# Patient Record
Sex: Female | Born: 1955 | Race: White | Hispanic: No | Marital: Single | Smoking: Former smoker
Health system: Southern US, Community
[De-identification: ages and names within clinical notes are randomized; demographics above are authoritative.]

## PROBLEM LIST (undated history)

## (undated) DIAGNOSIS — M1990 Unspecified osteoarthritis, unspecified site: Secondary | ICD-10-CM

## (undated) DIAGNOSIS — Z8679 Personal history of other diseases of the circulatory system: Secondary | ICD-10-CM

## (undated) DIAGNOSIS — J189 Pneumonia, unspecified organism: Secondary | ICD-10-CM

## (undated) DIAGNOSIS — Z8489 Family history of other specified conditions: Secondary | ICD-10-CM

## (undated) DIAGNOSIS — Z8719 Personal history of other diseases of the digestive system: Secondary | ICD-10-CM

## (undated) DIAGNOSIS — I10 Essential (primary) hypertension: Secondary | ICD-10-CM

## (undated) DIAGNOSIS — E0500 Thyrotoxicosis with diffuse goiter without thyrotoxic crisis or storm: Secondary | ICD-10-CM

## (undated) DIAGNOSIS — E039 Hypothyroidism, unspecified: Secondary | ICD-10-CM

## (undated) DIAGNOSIS — K5792 Diverticulitis of intestine, part unspecified, without perforation or abscess without bleeding: Secondary | ICD-10-CM

## (undated) DIAGNOSIS — R87619 Unspecified abnormal cytological findings in specimens from cervix uteri: Secondary | ICD-10-CM

## (undated) DIAGNOSIS — E785 Hyperlipidemia, unspecified: Secondary | ICD-10-CM

## (undated) HISTORY — DX: Unspecified abnormal cytological findings in specimens from cervix uteri: R87.619

## (undated) HISTORY — DX: Personal history of other diseases of the circulatory system: Z86.79

## (undated) HISTORY — DX: Thyrotoxicosis with diffuse goiter without thyrotoxic crisis or storm: E05.00

## (undated) HISTORY — PX: COCCYX REMOVAL: SHX600

## (undated) HISTORY — PX: NASAL SEPTUM SURGERY: SHX37

## (undated) HISTORY — PX: BICEPS TENDON REPAIR: SHX566

## (undated) HISTORY — DX: Hyperlipidemia, unspecified: E78.5

## (undated) HISTORY — DX: Diverticulitis of intestine, part unspecified, without perforation or abscess without bleeding: K57.92

## (undated) HISTORY — PX: KNEE RECONSTRUCTION: SHX5883

## (undated) HISTORY — DX: Personal history of other diseases of the digestive system: Z87.19

## (undated) HISTORY — DX: Essential (primary) hypertension: I10

## (undated) HISTORY — PX: CERVICAL CONE BIOPSY: SUR198

---

## 1995-04-12 DIAGNOSIS — E0500 Thyrotoxicosis with diffuse goiter without thyrotoxic crisis or storm: Secondary | ICD-10-CM

## 1995-04-12 HISTORY — DX: Thyrotoxicosis with diffuse goiter without thyrotoxic crisis or storm: E05.00

## 2000-07-31 ENCOUNTER — Other Ambulatory Visit: Admission: RE | Admit: 2000-07-31 | Discharge: 2000-07-31 | Payer: Self-pay | Admitting: Internal Medicine

## 2001-03-26 ENCOUNTER — Ambulatory Visit (HOSPITAL_COMMUNITY): Admission: RE | Admit: 2001-03-26 | Discharge: 2001-03-26 | Payer: Self-pay | Admitting: Internal Medicine

## 2001-03-26 ENCOUNTER — Encounter: Payer: Self-pay | Admitting: Internal Medicine

## 2001-07-20 ENCOUNTER — Other Ambulatory Visit: Admission: RE | Admit: 2001-07-20 | Discharge: 2001-07-20 | Payer: Self-pay | Admitting: Internal Medicine

## 2001-08-28 ENCOUNTER — Ambulatory Visit (HOSPITAL_BASED_OUTPATIENT_CLINIC_OR_DEPARTMENT_OTHER): Admission: RE | Admit: 2001-08-28 | Discharge: 2001-08-28 | Payer: Self-pay | Admitting: Orthopaedic Surgery

## 2002-10-23 ENCOUNTER — Other Ambulatory Visit: Admission: RE | Admit: 2002-10-23 | Discharge: 2002-10-23 | Payer: Self-pay | Admitting: Family Medicine

## 2003-02-25 ENCOUNTER — Ambulatory Visit (HOSPITAL_COMMUNITY): Admission: RE | Admit: 2003-02-25 | Discharge: 2003-02-25 | Payer: Self-pay | Admitting: Family Medicine

## 2003-07-18 ENCOUNTER — Ambulatory Visit (HOSPITAL_COMMUNITY): Admission: RE | Admit: 2003-07-18 | Discharge: 2003-07-18 | Payer: Self-pay | Admitting: Family Medicine

## 2004-03-19 ENCOUNTER — Ambulatory Visit (HOSPITAL_COMMUNITY): Admission: RE | Admit: 2004-03-19 | Discharge: 2004-03-19 | Payer: Self-pay | Admitting: Family Medicine

## 2004-05-17 ENCOUNTER — Ambulatory Visit (HOSPITAL_COMMUNITY): Admission: RE | Admit: 2004-05-17 | Discharge: 2004-05-17 | Payer: Self-pay | Admitting: Family Medicine

## 2004-07-15 ENCOUNTER — Encounter: Admission: RE | Admit: 2004-07-15 | Discharge: 2004-07-15 | Payer: Self-pay | Admitting: Family Medicine

## 2004-08-11 ENCOUNTER — Encounter: Admission: RE | Admit: 2004-08-11 | Discharge: 2004-11-09 | Payer: Self-pay | Admitting: Family Medicine

## 2004-08-25 ENCOUNTER — Other Ambulatory Visit: Admission: RE | Admit: 2004-08-25 | Discharge: 2004-08-25 | Payer: Self-pay | Admitting: Family Medicine

## 2005-01-25 ENCOUNTER — Inpatient Hospital Stay (HOSPITAL_COMMUNITY): Admission: RE | Admit: 2005-01-25 | Discharge: 2005-01-28 | Payer: Self-pay | Admitting: Orthopaedic Surgery

## 2005-03-30 ENCOUNTER — Ambulatory Visit (HOSPITAL_COMMUNITY): Admission: RE | Admit: 2005-03-30 | Discharge: 2005-03-30 | Payer: Self-pay | Admitting: Family Medicine

## 2005-04-11 HISTORY — PX: TOTAL KNEE ARTHROPLASTY: SHX125

## 2005-09-01 ENCOUNTER — Other Ambulatory Visit: Admission: RE | Admit: 2005-09-01 | Discharge: 2005-09-01 | Payer: Self-pay | Admitting: Family Medicine

## 2006-03-08 ENCOUNTER — Ambulatory Visit (HOSPITAL_COMMUNITY): Admission: RE | Admit: 2006-03-08 | Discharge: 2006-03-08 | Payer: Self-pay | Admitting: Family Medicine

## 2006-03-31 ENCOUNTER — Ambulatory Visit (HOSPITAL_COMMUNITY): Admission: RE | Admit: 2006-03-31 | Discharge: 2006-03-31 | Payer: Self-pay | Admitting: Family Medicine

## 2006-04-28 ENCOUNTER — Encounter: Admission: RE | Admit: 2006-04-28 | Discharge: 2006-04-28 | Payer: Self-pay | Admitting: Family Medicine

## 2007-04-03 ENCOUNTER — Ambulatory Visit (HOSPITAL_COMMUNITY): Admission: RE | Admit: 2007-04-03 | Discharge: 2007-04-03 | Payer: Self-pay | Admitting: Family Medicine

## 2007-08-10 ENCOUNTER — Other Ambulatory Visit: Admission: RE | Admit: 2007-08-10 | Discharge: 2007-08-10 | Payer: Self-pay | Admitting: Family Medicine

## 2008-04-07 ENCOUNTER — Ambulatory Visit (HOSPITAL_COMMUNITY): Admission: RE | Admit: 2008-04-07 | Discharge: 2008-04-07 | Payer: Self-pay | Admitting: Family Medicine

## 2008-08-29 ENCOUNTER — Other Ambulatory Visit: Admission: RE | Admit: 2008-08-29 | Discharge: 2008-08-29 | Payer: Self-pay | Admitting: Family Medicine

## 2009-04-08 ENCOUNTER — Ambulatory Visit (HOSPITAL_COMMUNITY): Admission: RE | Admit: 2009-04-08 | Discharge: 2009-04-08 | Payer: Self-pay | Admitting: Family Medicine

## 2009-09-16 ENCOUNTER — Other Ambulatory Visit: Admission: RE | Admit: 2009-09-16 | Discharge: 2009-09-16 | Payer: Self-pay | Admitting: Family Medicine

## 2009-11-10 ENCOUNTER — Encounter: Admission: RE | Admit: 2009-11-10 | Discharge: 2009-11-10 | Payer: Self-pay | Admitting: Family Medicine

## 2010-04-14 ENCOUNTER — Ambulatory Visit (HOSPITAL_COMMUNITY)
Admission: RE | Admit: 2010-04-14 | Discharge: 2010-04-14 | Payer: Self-pay | Source: Home / Self Care | Attending: Family Medicine | Admitting: Family Medicine

## 2010-08-27 NOTE — Discharge Summary (Signed)
Holly Beard, Holly Beard               ACCOUNT NO.:  [id number]   MEDICAL RECORD NO.:  [id number]          PATIENT TYPE:  INP   LOCATION:  5007                         FACILITY:  [facility]   PHYSICIAN:  Lubertha Basque. Dalldorf, M.D.DATE OF BIRTH:  1955/07/27   DATE OF ADMISSION:  01/25/2005  DATE OF DISCHARGE:  01/28/2005                                 DISCHARGE SUMMARY   ADMISSION DIAGNOSES:  1.  End-stage degenerative joint disease right knee.  2.  History of hypertension.  3.  History of mitral valve prolapse.   DISCHARGE DIAGNOSES:  1.  End-stage degenerative joint disease right knee.  2.  History of hypertension.  3.  History of mitral valve prolapse.   OPERATION:  Right total knee replacement.   BRIEF HISTORY:  Holly Beard is a 55 year old  female patient well-known to  our practice.  She has had years of discomfort in her right knee.  Now she  is having pain with every step, trouble sleeping at night time and  significantly reduced motion.  She had an operation many years ago for knee  reconstruction and this probably has predisposed her to the degenerative  changes she has now.  I discussed with her, treatment  options.  That being  total knee replacement, the risk of anesthesia, infection, DVT and possible  death.   PERTINENT LABORATORY AND X-RAY FINDINGS:  Sodium 134, potassium 3.7, glucose  119, BUN 4, creatinine 0.8, calcium 8.1.  Hemoglobin 11.3, hematocrit 32.8.  Last INR of 1.4.  Blood was checked serially as she was on low dose Coumadin  protocol per pharmacy.   Chest x-ray with no active disease.   HOSPITAL COURSE:  She was admitted, operated upon, and postoperatively was  on a PCA morphine pump adult dose protocol 4 mg load 1 to 2 mg every 10  minutes, 25 mg over four hours.  Colace, Senokot, Trinsicon tablets,  Coumadin and Lovenox per pharmacy DVT prophylaxis protocol.  She is on an IV  g of Ancef q.8h. x3 doses and her home medicines were reconciled with her  home  medicine sheet.  Also on Tylenol one or two q.6-8h. p.r.n. temperature  elevation, knee-high TEDs, incentive spirometry, knee immobilizer to her  right knee when she is up and out of bed.  PT, OT consults were done.  Follow-up lab studies were also done as above.  CPM machine 0 to 50 degrees  and advance as tolerated.   Her first day postoperatively, her vital signs were stable.  She was using  her PCA.  Temperature maxed out at 100.8.  Calf was nontender.  Lab studies  were normal and she was progressing with physical therapy, weightbearing as  tolerated.   The second day postoperatively, her blood pressure was 117/73.  Lungs clear  to A&P.  Abdomen was soft.  Hemoglobin 11.3.  INR 1.2.  Dressing was  changed.  Wound was benign.  Drain was pulled.  Normal neurovascular status.  No sign of infection.   The third day postoperatively, her blood pressure was 116/66, temperature  100 and then on a reading just  prior to that, 99.  Her INR was 1.4.  Lungs  were clear.  Abdomen was soft.  Wound was benign without sign of infection.  Motion 0 to 75 to 80 degrees with good neurovascular status.   ASSESSMENT:  Improved at that time.   FOLLOW UP:  She will be discharged home.   CONDITION ON DISCHARGE:  Improved.   She may change her dressing on a daily basis.  If any sign of infection, to  call [phone].  She can use crutches or a walker.  No driving for a minimum  of three weeks.  Prescription for Percocet was given one or two q.4-6h.  p.r.n. pain and then a prescription for Coumadin dose regulated by pharmacy.  [facility] for blood draws and home therapy also.   DIET:  No restriction other than those pertaining to Coumadin  administration.   HOME MEDICATIONS RECONCILED:  1.  Prempro 0.45 one a day.  2.  Toprol 100 one a day.  3.  Zyrtec 10 mg one a day.  4.  Rhinocort one or two sprays a day.  5.  Vitamin C can be restarted.  6.  Calcium can be restarted.  7.  Metamucil  continue.   We will follow up in the office in 10 days for stitch removal stitch  removal.  If any sign of infection or irritation she is to call earlier, [phone].      Holly Beard, P.A.      Lubertha Basque Jerl Santos, M.D.  Electronically Signed    MC/MEDQ  D:  01/28/2005  T:  01/28/2005  Job:  [id number]

## 2010-08-27 NOTE — Op Note (Signed)
[place]  Patient:    Holly Beard, Holly Beard Visit Number: [id number] MRN: [id number]          Service Type: DSU Location: [facility] Attending Physician:  Marcene Corning Dictated by:   Lubertha Basque. Jerl Santos, M.D. Proc. Date: 08/28/01 Admit Date:  08/28/2001                             Operative Report  PREOPERATIVE DIAGNOSIS: 1. Right knee torn medial and torn lateral meniscus. 2. Right knee degenerative joint disease. 3. Right knee loose bodies.  POSTOPERATIVE DIAGNOSIS: 1. Right knee torn medial and torn lateral meniscus. 2. Right knee degenerative joint disease. 3. Right knee loose bodies.  OPERATION PERFORMED: 1. Right knee partial medial and partial lateral meniscectomy. 2. Right knee chondroplasty, all three compartments. 3. Right knee removal of loose body times three.  ANESTHESIA:  Knee block and MAC.  ATTENDING SURGEON:  Lubertha Basque. Jerl Santos, M.D.  ASSISTANT:  Lindwood Qua, P.A.  INDICATIONS FOR PROCEDURE:  The patient is a 55 year old woman many years out from a complex knee reconstruction.  At this point she is left with a knee which is painful and swelled and prohibits athletic activity.  She has a bad catch inside her knee.  She has undergone a preoperative MRI scan which showed some significant loose bodies, degenerative change and torn menisci.  She is offered an arthroscopy.  The procedure was discussed with the patient and informed operative consent was obtained after discussion of possible complications of reaction to anesthesia and infection.  DESCRIPTION OF PROCEDURE:  The patient was taken to an operating suite where a knee block was applied along with MAC.  She was then positioned supine and prepped and draped in normal sterile fashion.  Her range of motion even under anesthesia was just about 5 to 95 degrees.  After administration of preop  IV antibiotics, an arthroscopy of the right knee was performed through  two inferior portals.  The suprapatellar pouch was benign except for a large 1 cm diameter bony loose body which was removed.  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  The patella did track well.  Medial compartment was notable for a complex tear of the medial meniscus in its small residual portion.  About a 15% partial medial meniscectomy was done leaving her with just a very small portion of the meniscus in place. She had grade 4 change throughout a good portion of the medial compartment addressed with a chondroplasty through the loose flaps and surrounding areas.  The ACL graft was quite loose and the notch stenotic consistent with no function to this graft but a small portion of this was nevertheless left intact.  A large loose body was removed from the notch which measured greater than 1 cm in diameter.  This was another bony loose body.  A third loose body was located behind this dysfunctional graft and was removed. The lateral compartment was notable for a complex tear of the middle and posterior horns of the lateral meniscus addressed with a 15% partial lateral meniscectomy leaving about half of this meniscus still in place.  She had grade 4 change throughout this compartment also addressed with a chondroplasty.  The knee was thoroughly irrigated at the end of the case followed by placement of Marcaine with epinephrine.  Some Depo-Medrol was also added.  Adaptic was placed over her portals followed by  dry gauze and a lose Ace wrap.  Estimated blood loss and intraoperative fluids can be obtained from Anesthesia records.  DISPOSITION:  The patient was taken to the recovery room in stable condition. Plans were for her to go home the same day and to follow up in the office in less than a week.  I will contact her by phone tonight. Dictated by:   Lubertha Basque Jerl Santos, M.D. Attending Physician:  Marcene Corning DD:  08/28/01 TD:   08/29/01 Job: [id number]

## 2010-08-27 NOTE — Op Note (Signed)
NAMESHAILI, DONALSON               ACCOUNT NO.:  [id number]   MEDICAL RECORD NO.:  [id number]          PATIENT TYPE:  INP   LOCATION:  2899                         FACILITY:  [facility]   PHYSICIAN:  Lubertha Basque. Dalldorf, M.D.DATE OF BIRTH:  June 19, 1955   DATE OF PROCEDURE:  01/25/2005  DATE OF DISCHARGE:                                 OPERATIVE REPORT   PREOPERATIVE DIAGNOSIS:  Right knee degenerative arthritis.   POSTOPERATIVE DIAGNOSIS:  Right knee degenerative arthritis.   PROCEDURE:  Right total knee replacement.   ANESTHESIA:  General and block.   ATTENDING SURGEON:  Lubertha Basque. Jerl Santos, M.D.   ASSISTANT:  Lindwood Qua, P.A.   INDICATIONS FOR PROCEDURE:  The patient is a 55 year old woman who has a  long history of right knee pain. She injured her knee about 30 years ago and  underwent complicated reconstructions. She has experienced progressive  degeneration. This has persisted despite oral anti-inflammatories and  injections and even an arthroscopy some time ago. She has bone-on-bone  degenerative change on x-ray. She has pain which limits her ability to rest  and remain active and she is offered a knee replacement operation. Informed  operative consent was obtained after discussion of the possible  complications of reaction to anesthesia, infection, DVT, PE, and death.   DESCRIPTION OF PROCEDURE:  The patient was taken to the operating suite  where general anesthetic was applied without difficulty. She was also given  a block in the preanesthesia area. She was positioned supine and prepped and  draped in normal sterile fashion. After administration of preoperative IV  antibiotics, the right leg was elevated, exsanguinated and a tourniquet  inflated about her thigh. One of her old incisions was utilized with a small  extension in a lateral direction near the tibial tubercle. We dissected down  to the extensor mechanism.  A medial parapatellar incision was made. The  kneecap  was flipped and the knee flexed. She did have a significant varus  deformity so I released soft tissues including a portion of the MCL off the  medial aspect of the tibia until we could rotate the tibia forward. I  removed some osteophytes from the medial aspect of the joint as well. Some  residual meniscal tissues were removed though frankly there was very little  left. We then used an extramedullary guide on the tibia in order to create a  cut with a slight posterior tilt on the proximal surface of this bone. An  intramedullary guide was then placed in the femur in order to make anterior  and posterior cuts creating a flexion gap of about 10 mm. A second  intramedullary guide was placed in the femur in order to make a distal cut  creating an equal extension gap of 10 mm. We then set her up loose in terms  of balancing as she did have both extension and flexion contractures. The  tibia sized to a 4 and the femur to a standard plus and the appropriate  guides were placed and utilized. The patella was cut down in thickness by 9  mm down to  14 and sized  to a 38 with the appropriate guide placed and  utilized. I then performed a trial reduction with the aforementioned  components and a 10 mm spacer. She easily came to slight hyperextension and  flexed well. The kneecap did track in a fairly lateral position and I did  perform a partial lateral release to address that issue. Trial components  were removed followed by pulsatile lavage irrigation of all three cut bones.  Cement was mixed including antibiotic and was pressurized onto all three  bones, followed by placement of the DePuy LCS components which were again  standard plus femur, four tibia, 38 mm all polyethylene patella, and 10 mm  deep dish spacer. Pressure was held on the components until cement hardened  and excess cement was trimmed. The tourniquet was deflated and a small  amount of bleeding was easily controlled with Bovie  cautery. The knee was  again irrigated followed by placement of a drain exiting superolaterally.  The extensor mechanism was reapproximated with #1 Vicryl in interrupted  fashion. Subcutaneous tissues reapproximated with 0 and 2-0 undyed Vicryl  followed by skin closure with staples. The knee easily flexed to 110 degrees  against gravity at the end of the case which was about a 20 degree  improvement over her preoperative state. Adaptic was placed in the wound  followed by dry gauze and a loose Ace wrap.  Estimated blood loss and  intraoperative fluids as well as accurate tourniquet time can be obtained  from anesthesia records.   DISPOSITION:  The patient was extubated in the operating room and taken to  recovery room in stable condition. Plans were for her to be admitted to the  orthopedic surgery service for appropriate postop care to include  perioperative antibiotics and Coumadin plus Lovenox for DVT prophylaxis.      Lubertha Basque Jerl Santos, M.D.  Electronically Signed     PGD/MEDQ  D:  01/25/2005  T:  01/25/2005  Job:  [id number]

## 2010-10-29 ENCOUNTER — Other Ambulatory Visit (HOSPITAL_COMMUNITY)
Admission: RE | Admit: 2010-10-29 | Discharge: 2010-10-29 | Disposition: A | Discharge status: Home / Self Care | Payer: BC Managed Care – PPO | Source: Ambulatory Visit | Attending: Family Medicine | Admitting: Family Medicine

## 2010-10-29 ENCOUNTER — Other Ambulatory Visit: Payer: Self-pay | Admitting: Family Medicine

## 2010-10-29 DIAGNOSIS — Z124 Encounter for screening for malignant neoplasm of cervix: Secondary | ICD-10-CM | POA: Insufficient documentation

## 2011-03-23 ENCOUNTER — Other Ambulatory Visit (HOSPITAL_COMMUNITY): Payer: Self-pay | Admitting: Family Medicine

## 2011-03-23 DIAGNOSIS — Z1231 Encounter for screening mammogram for malignant neoplasm of breast: Secondary | ICD-10-CM

## 2011-04-27 ENCOUNTER — Ambulatory Visit (HOSPITAL_COMMUNITY)
Admission: RE | Admit: 2011-04-27 | Discharge: 2011-04-27 | Disposition: A | Discharge status: Home / Self Care | Payer: BC Managed Care – PPO | Source: Ambulatory Visit | Attending: Family Medicine | Admitting: Family Medicine

## 2011-04-27 DIAGNOSIS — Z1231 Encounter for screening mammogram for malignant neoplasm of breast: Secondary | ICD-10-CM | POA: Insufficient documentation

## 2011-11-25 ENCOUNTER — Other Ambulatory Visit (HOSPITAL_COMMUNITY)
Admission: RE | Admit: 2011-11-25 | Discharge: 2011-11-25 | Disposition: A | Discharge status: Home / Self Care | Payer: BC Managed Care – PPO | Source: Ambulatory Visit | Attending: Family Medicine | Admitting: Family Medicine

## 2011-11-25 ENCOUNTER — Other Ambulatory Visit: Payer: Self-pay | Admitting: Family Medicine

## 2011-11-25 DIAGNOSIS — Z124 Encounter for screening for malignant neoplasm of cervix: Secondary | ICD-10-CM | POA: Insufficient documentation

## 2012-03-16 ENCOUNTER — Other Ambulatory Visit (HOSPITAL_COMMUNITY): Payer: Self-pay | Admitting: Family Medicine

## 2012-03-16 DIAGNOSIS — Z1231 Encounter for screening mammogram for malignant neoplasm of breast: Secondary | ICD-10-CM

## 2012-04-30 ENCOUNTER — Ambulatory Visit (HOSPITAL_COMMUNITY)
Admission: RE | Admit: 2012-04-30 | Discharge: 2012-04-30 | Disposition: A | Discharge status: Home / Self Care | Payer: BC Managed Care – PPO | Source: Ambulatory Visit | Attending: Family Medicine | Admitting: Family Medicine

## 2012-04-30 DIAGNOSIS — Z1231 Encounter for screening mammogram for malignant neoplasm of breast: Secondary | ICD-10-CM

## 2013-04-01 ENCOUNTER — Other Ambulatory Visit (HOSPITAL_COMMUNITY): Payer: Self-pay | Admitting: Family Medicine

## 2013-04-01 DIAGNOSIS — Z1231 Encounter for screening mammogram for malignant neoplasm of breast: Secondary | ICD-10-CM

## 2013-05-01 ENCOUNTER — Ambulatory Visit (HOSPITAL_COMMUNITY)
Admission: RE | Admit: 2013-05-01 | Discharge: 2013-05-01 | Disposition: A | Discharge status: Home / Self Care | Payer: No Typology Code available for payment source | Source: Ambulatory Visit | Attending: Family Medicine | Admitting: Family Medicine

## 2013-05-01 DIAGNOSIS — Z1231 Encounter for screening mammogram for malignant neoplasm of breast: Secondary | ICD-10-CM | POA: Insufficient documentation

## 2013-05-24 ENCOUNTER — Other Ambulatory Visit (HOSPITAL_COMMUNITY): Payer: Self-pay | Admitting: Family Medicine

## 2013-05-24 DIAGNOSIS — Z1231 Encounter for screening mammogram for malignant neoplasm of breast: Secondary | ICD-10-CM

## 2013-11-22 ENCOUNTER — Encounter: Payer: Self-pay | Admitting: Certified Nurse Midwife

## 2013-11-22 ENCOUNTER — Ambulatory Visit (INDEPENDENT_AMBULATORY_CARE_PROVIDER_SITE_OTHER): Payer: No Typology Code available for payment source | Admitting: Certified Nurse Midwife

## 2013-11-22 VITALS — BP 130/84 | HR 76 | Resp 18 | Ht 63.0 in | Wt 160.0 lb

## 2013-11-22 DIAGNOSIS — Z124 Encounter for screening for malignant neoplasm of cervix: Secondary | ICD-10-CM

## 2013-11-22 DIAGNOSIS — Z01419 Encounter for gynecological examination (general) (routine) without abnormal findings: Secondary | ICD-10-CM

## 2013-11-22 DIAGNOSIS — I10 Essential (primary) hypertension: Secondary | ICD-10-CM | POA: Insufficient documentation

## 2013-11-22 DIAGNOSIS — E785 Hyperlipidemia, unspecified: Secondary | ICD-10-CM | POA: Insufficient documentation

## 2013-11-22 NOTE — Progress Notes (Signed)
Patient ID: Holly Beard, female   DOB: 27-Feb-1956, 58 y.o.   MRN: [id number] 58 y.o. G0P0000 Single Caucasian Fe here to re-establish gyn care and  for annual exam. Menopausal no HRT. Denies vaginal bleeding or vaginal dryness. No history UTI's. See PCP for aex and medication management for hypertension and cholesterol. Would like to continue here for Gyn care due to new young PCP. No health issues today.  No LMP recorded. Patient is postmenopausal.          Sexually active: No.  The current method of family planning is none.    Exercising: Yes.    Home exercise routine includes walking and elliptical, treadmill.  Ocassional biking.  "sporadically reglular". Smoker:  no  Health Maintenance: Pap:  11/25/11, WNL (results in EPIC) MMG:  05/01/13, Bi-Rads 1:  Negative class b density Colonoscopy:  2008, repeat in 10 years BMD:   Within 4-5 years, normal per patient TDaP:  2010 Labs: PCP, has appt in September  Self Breast Exam:  random   reports that she has quit smoking. She has never used smokeless tobacco. She reports that she drinks alcohol. She reports that she does not use illicit drugs.  Past Medical History  Diagnosis Date  . Hypertension   . Hyperlipidemia     Past Surgical History  Procedure Laterality Date  . Knee reconstruction Right     x 2  . Total knee arthroplasty Right 2007  . Coccyx removal      sledding accident  . Nasal septum surgery      Current Outpatient Prescriptions  Medication Sig Dispense Refill  . cetirizine (ZYRTEC) 10 MG tablet Take 10 mg by mouth daily.      Marland Kitchen ibuprofen (ADVIL,MOTRIN) 200 MG tablet Take 600 mg by mouth at bedtime.      Marland Kitchen MELATONIN PO Take by mouth.      . metoprolol succinate (TOPROL-XL) 50 MG 24 hr tablet Take 1 tablet by mouth daily.      . montelukast (SINGULAIR) 10 MG tablet Take 1 tablet by mouth daily.      . Olmesartan Medoxomil (BENICAR PO) Take 1 tablet by mouth daily.      . simvastatin (ZOCOR) 20 MG tablet Take 1  tablet by mouth daily.       No current facility-administered medications for this visit.    History reviewed. No pertinent family history.  ROS:  Pertinent items are noted in HPI.  Otherwise, a comprehensive ROS was negative.  Exam:   BP 130/84  Pulse 76  Ht 5\' 3"  (1.6 m)  Wt 160 lb (72.576 kg)  BMI 28.35 kg/m2 Height: 5\' 3"  (160 cm)  Ht Readings from Last 3 Encounters:  11/22/13 5\' 3"  (1.6 m)    General appearance: alert, cooperative and appears stated age Head: Normocephalic, without obvious abnormality, atraumatic Neck: no adenopathy, supple, symmetrical, trachea midline and thyroid normal to inspection and palpation and non-palpable Lungs: clear to auscultation bilaterally Breasts: normal appearance, no masses or tenderness, No nipple retraction or dimpling, No nipple discharge or bleeding, No axillary or supraclavicular adenopathy Heart: regular rate and rhythm Abdomen: soft, non-tender; no masses,  no organomegaly Extremities: extremities normal, atraumatic, no cyanosis or edema Skin: Skin color, texture, turgor normal. No rashes or lesions Lymph nodes: Cervical, supraclavicular, and axillary nodes normal. No abnormal inguinal nodes palpated Neurologic: Grossly normal   Pelvic: External genitalia:  no lesions              Urethra:  normal appearing urethra with no masses, tenderness or lesions              Bartholin's and Skene's: normal                 Vagina: normal appearing vagina with normal color and discharge, no lesions              Cervix: normal, no lesions, non tender              Pap taken: Yes.   Bimanual Exam:  Uterus:  normal size, contour, position, consistency, mobility, non-tender and mid position              Adnexa: normal adnexa and no mass, fullness, tenderness               Rectovaginal: Confirms               Anus:  normal sphincter tone, no lesions  A:  Well Woman with normal exam  Menopausal no HRT  Hypertension/cholesterol management  with PCP  P:   Reviewed health and wellness pertinent to exam  Discussed menopausal expectations and need to advise if vaginal bleeding  Pap smear taken today with HPVHR  Continue PCP follow up as indicated   counseled on menopause, adequate intake of calcium and vitamin D, diet and exercise  return annually or prn  An After Visit Summary was printed and given to the patient.

## 2013-11-22 NOTE — Patient Instructions (Signed)

## 2013-11-22 NOTE — Progress Notes (Signed)
Reviewed personally.  M. Suzanne Edge Mauger, MD.  

## 2013-11-27 LAB — IPS PAP TEST WITH HPV

## 2014-05-02 ENCOUNTER — Ambulatory Visit (HOSPITAL_COMMUNITY): Admission: RE | Admit: 2014-05-02 | Payer: No Typology Code available for payment source | Source: Ambulatory Visit

## 2014-05-07 ENCOUNTER — Ambulatory Visit (HOSPITAL_COMMUNITY)
Admission: RE | Admit: 2014-05-07 | Discharge: 2014-05-07 | Disposition: A | Discharge status: Home / Self Care | Payer: No Typology Code available for payment source | Source: Ambulatory Visit | Attending: Family Medicine | Admitting: Family Medicine

## 2014-05-07 DIAGNOSIS — Z1231 Encounter for screening mammogram for malignant neoplasm of breast: Secondary | ICD-10-CM | POA: Diagnosis present

## 2014-05-20 ENCOUNTER — Other Ambulatory Visit (HOSPITAL_COMMUNITY): Payer: Self-pay | Admitting: Family Medicine

## 2014-05-20 DIAGNOSIS — Z1231 Encounter for screening mammogram for malignant neoplasm of breast: Secondary | ICD-10-CM

## 2014-07-22 ENCOUNTER — Telehealth: Payer: Self-pay | Admitting: Certified Nurse Midwife

## 2014-07-22 NOTE — Telephone Encounter (Signed)
Pt is having some bloating and discomfort.

## 2014-07-22 NOTE — Telephone Encounter (Signed)
Spoke with patient. Patient states that she has been experiencing lower abdominal "cramping and bloating." "It feels like I am going to have my period but I have not had one since 2005." Denies any changes in bowel movements. States that cramping is midline and intermittent. Denies any discomfort with urination, lower back pain, or fevers. Denies any current discomfort. Advised patient will need to be seen for further evaluation. Patient is agreeable. Offered appointment for tomorrow morning but patient declines. Appointment scheduled for 4/14 at 11:15am with Regina Eck CNM. Patient is agreeable to date and time.   Routing to provider for final review. Patient agreeable to disposition. Will close encounter

## 2014-07-24 ENCOUNTER — Encounter: Payer: Self-pay | Admitting: Certified Nurse Midwife

## 2014-07-24 ENCOUNTER — Ambulatory Visit (INDEPENDENT_AMBULATORY_CARE_PROVIDER_SITE_OTHER): Payer: No Typology Code available for payment source | Admitting: Certified Nurse Midwife

## 2014-07-24 VITALS — BP 110/66 | HR 80 | Temp 98.8°F | Resp 20 | Ht 63.0 in | Wt 170.0 lb

## 2014-07-24 DIAGNOSIS — R319 Hematuria, unspecified: Secondary | ICD-10-CM

## 2014-07-24 DIAGNOSIS — N952 Postmenopausal atrophic vaginitis: Secondary | ICD-10-CM | POA: Diagnosis not present

## 2014-07-24 DIAGNOSIS — R109 Unspecified abdominal pain: Secondary | ICD-10-CM

## 2014-07-24 DIAGNOSIS — N950 Postmenopausal bleeding: Secondary | ICD-10-CM | POA: Diagnosis not present

## 2014-07-24 LAB — POCT URINALYSIS DIPSTICK
BILIRUBIN UA: NEGATIVE
Glucose, UA: NEGATIVE
KETONES UA: NEGATIVE
Leukocytes, UA: NEGATIVE
Nitrite, UA: NEGATIVE
PH UA: 5
PROTEIN UA: NEGATIVE
Urobilinogen, UA: NEGATIVE

## 2014-07-24 LAB — CBC WITH DIFFERENTIAL/PLATELET
Basophils Absolute: 0.1 10*3/uL (ref 0.0–0.1)
Basophils Relative: 1 % (ref 0–1)
EOS ABS: 0.2 10*3/uL (ref 0.0–0.7)
Eosinophils Relative: 3 % (ref 0–5)
HCT: 37.9 % (ref 36.0–46.0)
Hemoglobin: 13 g/dL (ref 12.0–15.0)
Lymphocytes Relative: 24 % (ref 12–46)
Lymphs Abs: 1.3 10*3/uL (ref 0.7–4.0)
MCH: 31 pg (ref 26.0–34.0)
MCHC: 34.3 g/dL (ref 30.0–36.0)
MCV: 90.5 fL (ref 78.0–100.0)
MPV: 9.8 fL (ref 8.6–12.4)
Monocytes Absolute: 0.4 10*3/uL (ref 0.1–1.0)
Monocytes Relative: 7 % (ref 3–12)
NEUTROS PCT: 65 % (ref 43–77)
Neutro Abs: 3.4 10*3/uL (ref 1.7–7.7)
Platelets: 239 10*3/uL (ref 150–400)
RBC: 4.19 MIL/uL (ref 3.87–5.11)
RDW: 13.2 % (ref 11.5–15.5)
WBC: 5.3 10*3/uL (ref 4.0–10.5)

## 2014-07-24 NOTE — Progress Notes (Addendum)
59 y.o. Single white female  G0P0000 here for complaint of ??pelvic pain vs abdominal bloating. Patient noted pain in central pubic area slightly sharp a few days ago. Has not noted any recently, but has felt bloated like with menses. Denies vaginal bleeding or vaginal dryness that she is aware of. Denies urinary frequency, urgency or dysuria. No increase vaginal discharge. No fever, chills, diarrhea or constipation or vomiting.Patient is not sexually active.    HPI. Pertinent to history  Exam:   BP 110/66 mmHg  Pulse 80  Temp(Src) 98.8 F (37.1 C) (Oral)  Resp 20  Ht 5\' 3"  (1.6 m)  Wt 170 lb (77.111 kg)  BMI 30.12 kg/m2 General appearance: alert, cooperative, appears stated age and no distress Skin: warm and dry Lungs:clear to auscultation bilaterally Abdomen:  soft, non-tender; bowel sounds normal; no masses,  no organomegaly, no rebound tenderness or point pain noted Lymph:  no enlarged or tender inguinal lymph nodes   Pelvic: External genitalia:  no lesions and normal appearance              Urethra: not indicated and normal appearing urethra with no masses, tenderness or lesions              Bartholins and Skenes: normal                 Vagina: atrophic appearing vagina, scant blood noted in posterior fornix, shown to patient              Cervix: normal appearance and non tender, blood noted from cervix small amount red brown color x 3 with Q - tip from inside cervical os also              Pap taken: No. Bimanual Exam:  Uterus:  uterus is normal size, shape, consistency and nontender                               Adnexa:    normal adnexa in size, nontender and no masses                               Rectovaginal: confirms                               Anus:  Normal appearance  Wet prep was obtained.  Results:  no pathogens, pH 4.0 and red blood cells only  A: Post menopausal bleeding      R/O UTI with hematuria noted      Wet prep negative for pathogens      Atrophic  vaginitis  P:Reviewed findings with patient of PMB and from cervical os. Discussed need for evaluation with Endometrial biopsy and possible PUS. Patient agreeable to both. Patient has seen Dr. Sabra Heck and would like to schedule with her. Questions addressed. Patient will be called with insurance info and scheduled. Advise if bleeding noted.  Labs:  Urine micro/culture  CBC with Diff   Warning of UTI given and advise if occurs. Increase water intake. Discussed negative wet prep and vaginal changes noted at introital area and suggested Coconut oil use 2- 3 times weekly. Can also use vaginally, but not until after procedure done. Etiology of vaginal changes discussed. Do not feel blood is from vaginal walls due to presence noted at cervix and endocervical area. Questions addressed.  RV prn / as above An After Visit Summary was printed and given to the patient.

## 2014-07-24 NOTE — Patient Instructions (Signed)

## 2014-07-25 ENCOUNTER — Telehealth: Payer: Self-pay | Admitting: Certified Nurse Midwife

## 2014-07-25 LAB — URINALYSIS, MICROSCOPIC ONLY
Bacteria, UA: NONE SEEN
CRYSTALS: NONE SEEN
Casts: NONE SEEN
Squamous Epithelial / LPF: NONE SEEN

## 2014-07-25 LAB — URINE CULTURE
Colony Count: NO GROWTH
Organism ID, Bacteria: NO GROWTH

## 2014-07-25 NOTE — Telephone Encounter (Signed)
Call to patient. Advised of benefit quote received for EMB.  Patient agreeable. Scheduled procedure. Advised patient of 72 hour cancellation policy and $628 cancellation fee. Patient agreeable.

## 2014-07-27 NOTE — Progress Notes (Signed)
Reviewed personally.  M. Suzanne Terren Jandreau, MD.  

## 2014-07-28 ENCOUNTER — Telehealth: Payer: Self-pay

## 2014-07-28 DIAGNOSIS — N950 Postmenopausal bleeding: Secondary | ICD-10-CM

## 2014-07-28 NOTE — Telephone Encounter (Signed)
Spoke with patient. Patient states "From my understanding this is to rule out anything that could include cancer. I want to make sure I get it as soon as I can." Requesting to move appointment up to this week. Appointment moved to 4/22 at 10am with Dr.Miller. Patient is agreeable to date and time. Advised patient will need to take 800mg  of ibuprofen 1 hour prior to appointment. Drink plenty of fluids and get a good breakfast. Patient is agreeable.  Routing to provider for final review. Patient agreeable to disposition. Will close encounter

## 2014-07-28 NOTE — Telephone Encounter (Signed)
-----   Message from [place], [place] sent at 07/28/2014 11:39 AM EDT ----- Patient notified of normal results as written by provider. Pt states she is worried & would like to get her endo biopsy done sooner,like this week if possible. Please call patient.

## 2014-07-29 ENCOUNTER — Telehealth: Payer: Self-pay | Admitting: Obstetrics & Gynecology

## 2014-07-29 NOTE — Telephone Encounter (Signed)
Spoke with patient. Patient is agreeable to be seen for PUS and EMB on Friday 4/22. Agreeable to appointment scheduled for 9:30am with Dr.Miller.  Cc: Lamont Snowball, RN

## 2014-07-29 NOTE — Telephone Encounter (Signed)
Patient is asking to talk with Holly Beard again regarding her benefits for her upcoming appointment 06/02/14.

## 2014-07-29 NOTE — Telephone Encounter (Signed)
Patient can have ultrasound on Friday as well. Will adjust appointment accordingly.Needs to arrive for Ultrasound at 930. Please notify patient.

## 2014-07-29 NOTE — Telephone Encounter (Signed)
I have moved patient appointment for EMB up to this Friday 08/01/2014 at 10am. Patient is agreeable to date and time. Was given EMB instructions in previous call. Please see note below.  Cc: Lamont Snowball, RN  Routing to [place] for review.Encounter previously closed.

## 2014-07-29 NOTE — Telephone Encounter (Signed)
She can come for the endometrial biopsy earlier if she wants and then return for the PUS.  Is this one that can be done on Friday? Sending to Gay Filler to check about this.

## 2014-07-29 NOTE — Addendum Note (Signed)
Addended by: Jaymes Graff on: 07/29/2014 12:45 PM   Modules accepted: Orders

## 2014-07-29 NOTE — Telephone Encounter (Signed)
Call to patient. Advised of benefit quote received for PUS/EMB.  °Patient agreeable. °

## 2014-07-30 NOTE — Telephone Encounter (Signed)
Patient to sign promissory note at appointment. Patient agreeable.

## 2014-07-30 NOTE — Telephone Encounter (Signed)
Call to patient. Patient wanted to discuss a payment arrangment for services scheduled Friday. Advised patient that I would discuss with Marketing executive and call her back. Patient agreeable.

## 2014-08-01 ENCOUNTER — Ambulatory Visit (INDEPENDENT_AMBULATORY_CARE_PROVIDER_SITE_OTHER): Payer: No Typology Code available for payment source | Admitting: Obstetrics & Gynecology

## 2014-08-01 ENCOUNTER — Ambulatory Visit: Payer: No Typology Code available for payment source | Admitting: Obstetrics & Gynecology

## 2014-08-01 ENCOUNTER — Encounter: Payer: Self-pay | Admitting: Obstetrics & Gynecology

## 2014-08-01 ENCOUNTER — Ambulatory Visit (INDEPENDENT_AMBULATORY_CARE_PROVIDER_SITE_OTHER): Payer: No Typology Code available for payment source

## 2014-08-01 DIAGNOSIS — N950 Postmenopausal bleeding: Secondary | ICD-10-CM

## 2014-08-01 DIAGNOSIS — N83202 Unspecified ovarian cyst, left side: Secondary | ICD-10-CM

## 2014-08-01 DIAGNOSIS — N8320 Unspecified ovarian cysts: Secondary | ICD-10-CM | POA: Diagnosis not present

## 2014-08-01 NOTE — Progress Notes (Signed)
59 y.o. G0 Singlefemale here for a pelvic ultrasound and endometrial biopsy due to PMP bleeding.  Pt seen 4/14 by French Ana due to some pelvic pain, cramping and bloating.  Reports she "knows her body really well" so this was different.  On exam, blood was seen vaginally.  Pt did have urine micro and culture done.  These were negative.  CBC was negative as well.  Pt has not seen any additional bleeding.  Pain is better.      No LMP recorded. Patient is postmenopausal.  Sexually active:  no  Contraception: no method  FINDINGS: UTERUS:  5.0 x 3.1 x 2.2 cm EMS:  2.65mm ADNEXA:   Left ovary 2.7 x 1.0 x 1.1cm with 55mm simple appearing cyst   Right ovary 2.0 x 0.8 x 0.9cm CUL DE SAC: no free fluid noted  Reviewed images with pt.  Due to bleeding, feel endometrial biospy is appropriate.  Discussed with patient.  Verbal and written consent obtained.    Procedure:  Speculum placed.  Cervix visualized and cleansed with betadine prep.  Streaky blood was present on exam.  Cervix was cleansed with Betadine x 3.  A single toothed tenaculum was applied to the anterior lip of the cervix.  Milex dilator was needed to dilate the cervix.  Endometrial pipelle was advanced through the cervix into the endometrial cavity without difficulty.  Pipelle passed to 6cm.  Suction applied and pipelle removed with scant tissue sample obtained.  Biopsy was repeated, again with scant tissue.  Tenculum removed.  No bleeding noted.  Patient tolerated procedure well.  Assessment:  PMP bleeding, thin endometrium PMP no HRT currently  Plan: Endometrial biopsy pending.  If negative (and suspect it will be), will recommend watching due to normal ultrasound findings.  Pt is comfortable with this plan.

## 2014-08-04 ENCOUNTER — Ambulatory Visit: Payer: No Typology Code available for payment source | Admitting: Obstetrics & Gynecology

## 2014-08-07 ENCOUNTER — Telehealth: Payer: Self-pay | Admitting: Emergency Medicine

## 2014-08-07 NOTE — Telephone Encounter (Signed)
-----   Message from [name], MD sent at 08/05/2014  5:15 PM EDT ----- Please inform pt that her endometrial biopsy was negative for abnormal cells.  Only "atrophic cells" were seen.  This can be an explanation for bleeding.  I don't want to treat with anything right now but would like to see her for follow up in 8 weeks.  She needs to call if has any bleeding that she sees (only Debbi and I have seen it).

## 2014-08-07 NOTE — Telephone Encounter (Signed)
Spoke with patient. She is given message from Dr. Sabra Heck. She states she had one episode of noting blood on tissue after voiding, but that it was very scant amount. Denies pain or dysuria and states "I didn't feel worried about it." Advised patient to call back if she notices any additional bleeding or if bleeding increases. Patient verbalized understanding.   Patient is scheduled for follow up with Dr. Sabra Heck 10/03/14 (patient choice of date).   Routing to Dr. Quincy Simmonds as covering.    cc Dr. Sabra Heck.

## 2014-10-03 ENCOUNTER — Ambulatory Visit (INDEPENDENT_AMBULATORY_CARE_PROVIDER_SITE_OTHER): Payer: No Typology Code available for payment source | Admitting: Obstetrics & Gynecology

## 2014-10-03 ENCOUNTER — Telehealth: Payer: Self-pay | Admitting: Obstetrics & Gynecology

## 2014-10-03 VITALS — BP 120/78 | HR 68 | Resp 12

## 2014-10-03 DIAGNOSIS — N950 Postmenopausal bleeding: Secondary | ICD-10-CM

## 2014-10-03 NOTE — Telephone Encounter (Signed)
Patient would like a call from nurse to reschedule a follow up appointment with Dr Sabra Heck.

## 2014-10-05 ENCOUNTER — Encounter: Payer: Self-pay | Admitting: Obstetrics & Gynecology

## 2014-10-05 NOTE — Progress Notes (Signed)
Pt rescheduled

## 2014-10-06 NOTE — Telephone Encounter (Signed)
Spoke with patient to reschedule appointment with Dr. Sabra Heck. Multiple days and times offered to accommodate patient schedule. Scheduled for 10/21/14 at 1115.  Routing to provider for final review. Patient agreeable to disposition. Will close encounter.

## 2014-10-07 ENCOUNTER — Ambulatory Visit: Payer: No Typology Code available for payment source | Admitting: Obstetrics & Gynecology

## 2014-10-21 ENCOUNTER — Ambulatory Visit (INDEPENDENT_AMBULATORY_CARE_PROVIDER_SITE_OTHER): Payer: No Typology Code available for payment source | Admitting: Obstetrics & Gynecology

## 2014-10-21 VITALS — BP 128/76 | Resp 18 | Ht 63.0 in

## 2014-10-21 DIAGNOSIS — R1013 Epigastric pain: Secondary | ICD-10-CM

## 2014-10-21 DIAGNOSIS — R140 Abdominal distension (gaseous): Secondary | ICD-10-CM

## 2014-10-21 LAB — CBC WITH DIFFERENTIAL/PLATELET
BASOS ABS: 0 10*3/uL (ref 0.0–0.1)
Basophils Relative: 0 % (ref 0–1)
Eosinophils Absolute: 0.1 10*3/uL (ref 0.0–0.7)
Eosinophils Relative: 1 % (ref 0–5)
HCT: 41.8 % (ref 36.0–46.0)
Hemoglobin: 14.2 g/dL (ref 12.0–15.0)
Lymphocytes Relative: 11 % — ABNORMAL LOW (ref 12–46)
Lymphs Abs: 0.9 10*3/uL (ref 0.7–4.0)
MCH: 31 pg (ref 26.0–34.0)
MCHC: 34 g/dL (ref 30.0–36.0)
MCV: 91.3 fL (ref 78.0–100.0)
MONOS PCT: 10 % (ref 3–12)
MPV: 9.5 fL (ref 8.6–12.4)
Monocytes Absolute: 0.8 10*3/uL (ref 0.1–1.0)
Neutro Abs: 6.4 10*3/uL (ref 1.7–7.7)
Neutrophils Relative %: 78 % — ABNORMAL HIGH (ref 43–77)
PLATELETS: 220 10*3/uL (ref 150–400)
RBC: 4.58 MIL/uL (ref 3.87–5.11)
RDW: 13.7 % (ref 11.5–15.5)
WBC: 8.2 10*3/uL (ref 4.0–10.5)

## 2014-10-21 LAB — COMPLETE METABOLIC PANEL WITH GFR
ALBUMIN: 4.5 g/dL (ref 3.5–5.2)
ALT: 25 U/L (ref 0–35)
AST: 25 U/L (ref 0–37)
Alkaline Phosphatase: 55 U/L (ref 39–117)
BUN: 21 mg/dL (ref 6–23)
CHLORIDE: 101 meq/L (ref 96–112)
CO2: 26 meq/L (ref 19–32)
CREATININE: 0.87 mg/dL (ref 0.50–1.10)
Calcium: 9.2 mg/dL (ref 8.4–10.5)
GFR, EST AFRICAN AMERICAN: 85 mL/min
GFR, Est Non African American: 74 mL/min
GLUCOSE: 78 mg/dL (ref 70–99)
Potassium: 4.4 mEq/L (ref 3.5–5.3)
Sodium: 138 mEq/L (ref 135–145)
Total Bilirubin: 1.3 mg/dL — ABNORMAL HIGH (ref 0.2–1.2)
Total Protein: 7.2 g/dL (ref 6.0–8.3)

## 2014-10-21 NOTE — Progress Notes (Signed)
Subjective:     Patient ID: Holly Beard, female   DOB: Apr 25, 1955, 59 y.o.   MRN: [id number]  HPI 59 yo G0 SWF here for follow up after having episode of increased abdominal bloating and discomfort in April.  She was initially seen by Melvia Heaps, CNM.  Pt reported then that symptoms sort of felt like what it feels like before a menstrual cycle.  Has been PMP for several years.  PUS done 08/01/14 showing normal uterus, endometrium, and normal right ovary.  Left ovary was normal was well but with a 85mm simple appearing cyst.  Endometrial biopsy was obtained due to bleeding that was noted on physical exam in April and this showed atrophic changes.  Pt reports she has done well really until over the weekend.  At that time, the same symptoms started back including increased bloating, fullness sensation, and nausea.  The nausea and bloating are exactly the same as the episode in April.  She is visually uncomfortable today in the office and sighs three or four times while describing symptoms.  Denies emesis, fever, bladder changes or bowel changes.  Reports having BM last night and this morning that were "normal".    Started Augmentin last night due to sinusitis but doesn't think this has anything to do with her symptoms.    Review of Systems  Constitutional: Negative.   Respiratory: Negative.   Cardiovascular: Negative.   Gastrointestinal: Positive for nausea and abdominal distention. Negative for vomiting, diarrhea, constipation and blood in stool.  Endocrine: Negative for cold intolerance and heat intolerance.  Genitourinary: Negative.   Musculoskeletal: Negative.   Skin: Negative.        Objective:   Physical Exam  Constitutional: She is oriented to person, place, and time. She appears well-developed and well-nourished.  Abdominal: Soft. She exhibits no distension and no mass. There is tenderness (diffuse in upper abdomen, all the way across). There is no rebound and no guarding.   Genitourinary: Vagina normal and uterus normal. There is no rash, tenderness, lesion or injury on the right labia. There is no rash, tenderness, lesion or injury on the left labia. Cervix exhibits no motion tenderness. Right adnexum displays no mass, no tenderness and no fullness. Left adnexum displays no mass, no tenderness and no fullness. No bleeding in the vagina.  Lymphadenopathy:       Right: No inguinal adenopathy present.       Left: No inguinal adenopathy present.  Neurological: She is alert and oriented to person, place, and time.  Skin: Skin is warm and dry.  Psychiatric: She has a normal mood and affect.       Assessment:     Upper abdominal pain with associated increased bloating and nausea (second episode) Episode of PMP bleeding in April with negative evaluation    Plan:     Due to second occurrence of these symptoms, feel should proceed with CT of abd/pelvis with/without contrast. CMP, CBC, estradiol obtained today.

## 2014-10-21 NOTE — Progress Notes (Signed)
Patient scheduled while in office for CT Abdomen Pelvis w wo contrast. Appointment scheduled for 10/23/2014 at 9:30am at [facility]. Patient aware she will need to pick up contrast material from [facility] imaging today or tomorrow. Placed in imaging hold. Order placed for precert.

## 2014-10-22 ENCOUNTER — Encounter: Payer: Self-pay | Admitting: Obstetrics & Gynecology

## 2014-10-22 ENCOUNTER — Telehealth: Payer: Self-pay | Admitting: Obstetrics & Gynecology

## 2014-10-22 ENCOUNTER — Ambulatory Visit
Admission: RE | Admit: 2014-10-22 | Discharge: 2014-10-22 | Disposition: A | Discharge status: Home / Self Care | Payer: No Typology Code available for payment source | Source: Ambulatory Visit | Attending: Obstetrics & Gynecology | Admitting: Obstetrics & Gynecology

## 2014-10-22 ENCOUNTER — Telehealth: Payer: Self-pay

## 2014-10-22 DIAGNOSIS — R1013 Epigastric pain: Secondary | ICD-10-CM

## 2014-10-22 DIAGNOSIS — R140 Abdominal distension (gaseous): Secondary | ICD-10-CM

## 2014-10-22 LAB — ESTRADIOL: Estradiol: 33.7 pg/mL

## 2014-10-22 MED ORDER — IOPAMIDOL (ISOVUE-300) INJECTION 61%
100.0000 mL | Freq: Once | INTRAVENOUS | Status: AC | PRN
  Administered 2014-10-22: 100 mL via INTRAVENOUS

## 2014-10-22 MED ORDER — IOHEXOL 300 MG/ML  SOLN
30.0000 mL | Freq: Once | INTRAMUSCULAR | Status: AC | PRN
  Administered 2014-10-22: 30 mL via ORAL

## 2014-10-22 NOTE — Telephone Encounter (Signed)
Spoke with patient. Advised of message as seen below from [place]. Patient is agreeable. Advised I will speak with [facility] to see if appointment can be moved to today. Patient is agreeable. Spoke with Candise Che at [facility]. CT Scan moved to today at 12:15pm. Patient is agreeable to date and time. Patient aware she should not eat anything between now and appointment time. Patient is agreeable. Advised I will let Dr.Miller know appointment has been moved up and that we will give her a call to go over results. Patient is agreeable.

## 2014-10-22 NOTE — Telephone Encounter (Signed)
Spoke with Wells Guiles at [facility] regarding patient's CT scan that is scheduled for 10/23/2014. Wells Guiles states patient will need CT W contrast. Advised this is okay to perform per discussion with Dr.Miller on 10/21/2014. Wells Guiles is agreeable and will change order to CT abd/pelvis with contrast. Patient is there to pick up contrast now and for instructions.  Routing to provider for final review. Patient agreeable to disposition. Will close encounter.   Patient aware provider will review message and nurse will return call if any additional advice or change of disposition.

## 2014-10-22 NOTE — Telephone Encounter (Signed)
Patient is in CT now.

## 2014-10-22 NOTE — Telephone Encounter (Signed)
Patient is scheduled for a CT scan tomorrow but wanted to let Dr Sabra Heck know she had a fever of 101.7 last night.

## 2014-10-22 NOTE — Telephone Encounter (Signed)
With her mildly elevated neutrophil count, I am a little anxious about infection, possibly diverticulitis.  She may want to consider going to the ER and having the scan today, unless it can be moved up as far as scheduling goes.  With a temp, the sooner the better.  Of course if pain worsens or symptoms worsen, she should absolutely go.

## 2014-10-22 NOTE — Telephone Encounter (Addendum)
Spoke with patient. Patient was seen yesterday 10/21/2014 with Dr.Miller. Scheduled for CT scan tomorrow at 9:30am for epigastric pain and nausea. Patient states last night she had a temperature of 101.7 Took her temperature this morning which was 99.5. Is still having some nausea but no vomiting. Was able to eat yogurt and oatmeal this morning. "I just feel achy and have abdominal discomfort. I don't know if it is a combination of things or what."  Patient would like to know if there is anything further she should do before her CT scan tomorrow. Advised will speak with Dr.Silva regarding symptoms and further recommendation and return call. Patient is agreeable.  Cc: Dr.Miller

## 2014-10-23 ENCOUNTER — Inpatient Hospital Stay: Admission: RE | Admit: 2014-10-23 | Payer: No Typology Code available for payment source | Source: Ambulatory Visit

## 2014-10-23 ENCOUNTER — Other Ambulatory Visit: Payer: Self-pay | Admitting: Obstetrics & Gynecology

## 2014-10-23 MED ORDER — CIPROFLOXACIN HCL 500 MG PO TABS: 500.0000 mg | ORAL_TABLET | Freq: Two times a day (BID) | ORAL | Status: DC

## 2014-10-23 MED ORDER — METRONIDAZOLE 500 MG PO TABS: 500.0000 mg | ORAL_TABLET | Freq: Three times a day (TID) | ORAL | Status: DC

## 2014-10-23 NOTE — Telephone Encounter (Signed)
Spoke with [facility] to check on status of patient appointment. Lenna Sciara is currently working on getting patient an appointment and will give our office a call once this has been scheduled.  Routing to [place] as Juluis Rainier

## 2014-10-23 NOTE — Telephone Encounter (Signed)
Spoke with pt regarding CT results.  Acute diverticulitis noted.  Cipro 500mg  bid x 10 days and Flagyl 500mg  tid x 10 days sent to pharmacy on file.    Clears for oral intake until symptom improvement discussed.  Pt aware needs future colonoscopy due to CT findings but needs to wait until diverticulitis is fully resolved.  Possible complications of current diagnosis discussed including obstruction and bowel perfomation.  Will contact GI for appt for pt.  May need follow up here in a few days until can be seen by GI.

## 2014-10-23 NOTE — Telephone Encounter (Signed)
Spoke with [facility]. Message has been sent to Triage in their office to get patient earliest appointment. OV notes, labs, CT results all faxed with cover sheet to [facility] for review. They will return call with patient's appointment date and time.

## 2014-10-23 NOTE — Telephone Encounter (Addendum)
Spoke with Holly Beard from [place]. Patient has appointment scheduled for Monday 10/27/2014 at 12:30pm. Spoke with patient. Advised of appointment date, time, and location. Patient is agreeable. Advised patient medications can take 24-48 hours to get in to system and show symptom improvement. Advised if symptoms worsen or develops fever again will need to be seen at local ER over the weekend. Patient is agreeable and verbalizes understanding. Will call our office if she needs anything prior.  Routing to [place] before closing. Anything further?

## 2014-10-23 NOTE — Telephone Encounter (Signed)
Results in EPIC and to Dr.Miller's desk for review.

## 2014-10-24 ENCOUNTER — Other Ambulatory Visit: Payer: No Typology Code available for payment source

## 2014-10-29 ENCOUNTER — Telehealth: Payer: Self-pay | Admitting: Obstetrics & Gynecology

## 2014-10-29 MED ORDER — NYSTATIN 100000 UNIT/ML MT SUSP: 5.0000 mL | Freq: Four times a day (QID) | OROMUCOSAL | Status: DC

## 2014-10-29 NOTE — Telephone Encounter (Signed)
Opened in error

## 2014-10-29 NOTE — Telephone Encounter (Signed)
Patient calling requesting to speak with the nurse. She said, "Dr. Sabra Heck has been treating me for diverticulitis and now I think I have thrush."

## 2014-10-29 NOTE — Telephone Encounter (Signed)
Returning call.

## 2014-10-29 NOTE — Telephone Encounter (Addendum)
Spoke with patient. Patient is currently taking Cipro 500 mg bid and Flagyl 500 mg tid is on day 7 of 10 for treatment for acute diverticulitis. Was started on medications by Dr.Miller on 7/14 after patient had CT scan on 7/13. Has been seen with [facility] on 7/18. Is scheduled for colonoscopy on 9/7. Woke up this morning and has a white thick coating on her tongue. Patient has had two nurses at work look at her tongue who state it is "thrush." Patient is requesting treatment for thrush at this time. Also asking if she needs to keep her aex scheduled for 8/23 with Regina Eck CNM since she had an EMB in April 2016 with our office. Advised I will speak with covering provider Dr.Silva and return call with further recommendations. Patient is agreeable.

## 2014-10-29 NOTE — Telephone Encounter (Signed)
OK for Nystatin oral liquid. 4 - 6 ml po qid. Retain in mouth as long as possible, then spit.  Use for 48 hours after symptoms resolve.  Dispense - 200 ml.  RF - none.   Please send to pharmacy of choice.  Have patient keep her annual exam appointment.

## 2014-10-29 NOTE — Telephone Encounter (Signed)
Left message to call [place] at [phone].  Needs to advised of message as seen below from [place] and that Rx has been sent to pharmacy on file.

## 2014-10-29 NOTE — Telephone Encounter (Signed)
Spoke with patient. Advised of message as seen below from [place]. Patient is agreeable and verbalizes understanding. Rx has been sent to pharmacy on file. Will keep aex as scheduled.  Cc: Dr.Miller  Routing to provider for final review. Patient agreeable to disposition. Will close encounter.   Patient aware provider will review message and nurse will return call if any additional advice or change of disposition.

## 2014-10-30 NOTE — Telephone Encounter (Signed)
Spoke with [facility]. Notes from 10/27/2014 OV with Dr.Schooler will be faxed to our office for Dr.Miller's review.  Routing to [place] as Juluis Rainier.

## 2014-10-30 NOTE — Telephone Encounter (Signed)
I have not seen note from [facility] from appt done on Monday.  Can you call and get a copy please?  Thanks.

## 2014-11-11 NOTE — Telephone Encounter (Signed)
Dr.Miller, have you received these records?

## 2014-11-11 NOTE — Telephone Encounter (Signed)
Yes. Thanks 

## 2014-12-02 ENCOUNTER — Ambulatory Visit (INDEPENDENT_AMBULATORY_CARE_PROVIDER_SITE_OTHER): Payer: No Typology Code available for payment source | Admitting: Certified Nurse Midwife

## 2014-12-02 ENCOUNTER — Encounter: Payer: Self-pay | Admitting: Certified Nurse Midwife

## 2014-12-02 VITALS — BP 100/64 | HR 70 | Resp 16 | Ht 63.25 in | Wt 167.0 lb

## 2014-12-02 DIAGNOSIS — Z124 Encounter for screening for malignant neoplasm of cervix: Secondary | ICD-10-CM

## 2014-12-02 DIAGNOSIS — Z01419 Encounter for gynecological examination (general) (routine) without abnormal findings: Secondary | ICD-10-CM

## 2014-12-02 NOTE — Progress Notes (Signed)
59 y.o. G0P0000 Single  Caucasian Fe here for annual exam. Menopausal no vaginal bleeding or vaginal dryness. Using coconut oil working well. Seeing GI for diverticulitis management. Has colonoscopy scheduled. Sees PCP for aex and medication management for hypertension/cholesterol and labs.taking probiotics per GI. No other health concerns today.   No LMP recorded. Patient is postmenopausal.          Sexually active: No.  The current method of family planning is post menopausal status.    Exercising: Yes.    walking Smoker:  no  Health Maintenance: Pap: 11-22-13 neg HPV HR neg MMG: 05-07-14 category b density,birads 1:neg Colonoscopy:  2076f/u 71yrs BMD:   Within 5-77yrs, normal per patient TDaP:  2010 Labs: none Self breast exam: done occ   reports that she has quit smoking. She has never used smokeless tobacco. She reports that she drinks alcohol. She reports that she does not use illicit drugs.  Past Medical History  Diagnosis Date  . Hypertension   . Hyperlipidemia   . History of rheumatic fever age 35  . Graves' disease without crisis 1997    remission  . Diverticulitis     Past Surgical History  Procedure Laterality Date  . Knee reconstruction Right     x 2  . Total knee arthroplasty Right 2007  . Coccyx removal      sledding accident  . Nasal septum surgery      Current Outpatient Prescriptions  Medication Sig Dispense Refill  . cetirizine (ZYRTEC) 10 MG tablet Take 10 mg by mouth daily.    Marland Kitchen ibuprofen (ADVIL,MOTRIN) 200 MG tablet Take 600 mg by mouth as needed.     Marland Kitchen losartan (COZAAR) 50 MG tablet daily.    Marland Kitchen MELATONIN PO Take by mouth as needed.    . metroNIDAZOLE (METROGEL) 0.75 % gel apply to affected area twice a day  1  . montelukast (SINGULAIR) 10 MG tablet Take 1 tablet by mouth daily.    . simvastatin (ZOCOR) 20 MG tablet Take 1 tablet by mouth daily.    . TOPROL XL 25 MG 24 hr tablet     . Triamcinolone Acetonide (NASACORT AQ NA) Place into the nose.      No current facility-administered medications for this visit.    Family History  Problem Relation Age of Onset  . Hyperlipidemia Mother   . Hypertension Mother   . Hypertension Father   . Hyperlipidemia Father   . Diabetes Sister   . Hypertension Sister   . Hyperlipidemia Sister   . Hypertension Brother   . Stroke Maternal Grandmother 73  . Heart attack Paternal Grandmother     ROS:  Pertinent items are noted in HPI.  Otherwise, a comprehensive ROS was negative.  Exam:   BP 100/64 mmHg  Pulse 70  Resp 16  Ht 5' 3.25" (1.607 m)  Wt 167 lb (75.751 kg)  BMI 29.33 kg/m2 Height: 5' 3.25" (160.7 cm) (pt declines) Ht Readings from Last 3 Encounters:  12/02/14 5' 3.25" (1.607 m)  10/21/14 5\' 3"  (1.6 m)  07/24/14 5\' 3"  (1.6 m)    General appearance: alert, cooperative and appears stated age Head: Normocephalic, without obvious abnormality, atraumatic Neck: no adenopathy, supple, symmetrical, trachea midline and thyroid normal to inspection and palpation Lungs: clear to auscultation bilaterally Breasts: normal appearance, no masses or tenderness, No nipple retraction or dimpling, No nipple discharge or bleeding, No axillary or supraclavicular adenopathy Heart: regular rate and rhythm Abdomen: soft, non-tender; no masses,  no  organomegaly Extremities: extremities normal, atraumatic, no cyanosis or edema Skin: Skin color, texture, turgor normal. No rashes or lesions Lymph nodes: Cervical, supraclavicular, and axillary nodes normal. No abnormal inguinal nodes palpated Neurologic: Grossly normal   Pelvic: External genitalia:  no lesions              Urethra:  normal appearing urethra with no masses, tenderness or lesions              Bartholin's and Skene's: normal                 Vagina: normal appearing vagina with normal color and discharge, no lesions              Cervix: normal,non tender, no lesions              Pap taken: Yes.   Bimanual Exam:  Uterus:  normal size,  contour, position, consistency, mobility, non-tender              Adnexa: normal adnexa and no mass, fullness, tenderness               Rectovaginal: Confirms               Anus:  normal sphincter tone, no lesions  Chaperone present: Yes  A:  Well Woman with normal exam  Menopausal no HRT  Vaginal dryness coconut oil working well   Hypertension/cholesterol management with PCP  Diverticulitis under follow up with GI  P:   Reviewed health and wellness pertinent to exam  Aware of need to evaluate if vaginal bleeding  Continue follow up with GI as indicated  Pap smear as above with HPV reflex   counseled on breast self exam, mammography screening, adequate intake of calcium and vitamin D, diet and exercise  return annually or prn  An After Visit Summary was printed and given to the patient.

## 2014-12-02 NOTE — Progress Notes (Signed)
Reviewed personally.  M. Suzanne Toneka Fullen, MD.  

## 2014-12-02 NOTE — Patient Instructions (Signed)

## 2014-12-03 LAB — IPS PAP TEST WITH REFLEX TO HPV

## 2015-05-11 ENCOUNTER — Ambulatory Visit (HOSPITAL_COMMUNITY): Payer: No Typology Code available for payment source

## 2015-05-11 ENCOUNTER — Ambulatory Visit
Admission: RE | Admit: 2015-05-11 | Discharge: 2015-05-11 | Disposition: A | Discharge status: Home / Self Care | Payer: Managed Care, Other (non HMO) | Source: Ambulatory Visit | Attending: Family Medicine | Admitting: Family Medicine

## 2015-05-11 DIAGNOSIS — Z1231 Encounter for screening mammogram for malignant neoplasm of breast: Secondary | ICD-10-CM

## 2015-12-04 ENCOUNTER — Encounter: Payer: Self-pay | Admitting: Certified Nurse Midwife

## 2015-12-04 ENCOUNTER — Ambulatory Visit (INDEPENDENT_AMBULATORY_CARE_PROVIDER_SITE_OTHER): Payer: Managed Care, Other (non HMO) | Admitting: Certified Nurse Midwife

## 2015-12-04 VITALS — BP 112/76 | HR 60 | Resp 12 | Ht 63.0 in | Wt 175.8 lb

## 2015-12-04 DIAGNOSIS — R319 Hematuria, unspecified: Secondary | ICD-10-CM

## 2015-12-04 DIAGNOSIS — Z0000 Encounter for general adult medical examination without abnormal findings: Secondary | ICD-10-CM | POA: Diagnosis not present

## 2015-12-04 DIAGNOSIS — Z01419 Encounter for gynecological examination (general) (routine) without abnormal findings: Secondary | ICD-10-CM | POA: Diagnosis not present

## 2015-12-04 LAB — POCT URINALYSIS DIPSTICK
BILIRUBIN UA: NEGATIVE
GLUCOSE UA: NEGATIVE
KETONES UA: NEGATIVE
Leukocytes, UA: NEGATIVE
Nitrite, UA: NEGATIVE
PH UA: 5
Protein, UA: NEGATIVE
Urobilinogen, UA: NEGATIVE

## 2015-12-04 NOTE — Progress Notes (Signed)
60 y.o. G0P0000 Single  Caucasian Fe here for annual exam. Menopausal no HRT. Denies vaginal bleeding. Vaginal dryness so much better with coconut oil use. Sees PCP yearly for labs, hypertension and cholesterol management.. Had colonoscopy with diverticulitis noted, has adjusted diet to help with, no other treatment at this point. Has ?carpal tunnel issues with right hand. Will see MD if needed. No other health issues today.  No LMP recorded. Patient is postmenopausal.          Sexually active: No.  The current method of family planning is post menopausal status.    Exercising: Yes.    Walking Smoker:  no  Health Maintenance: Pap: 12/02/14 WNL MMG:  05/11/15 BIRADS1 Colonoscopy: 2016 Diverticulitis working with diet.  BMD:   6-7 years ago.  TDaP: 2010 Hep C and HIV: Unsure Labs: PCP                Urine: 3+ blood   reports that she has quit smoking. She has never used smokeless tobacco. She reports that she drinks about 3.0 - 6.0 oz of alcohol per week . She reports that she does not use drugs.  Past Medical History:  Diagnosis Date  . Diverticulitis   . Graves' disease without crisis 1997   remission  . History of rheumatic fever age 39  . Hyperlipidemia   . Hypertension     Past Surgical History:  Procedure Laterality Date  . COCCYX REMOVAL     sledding accident  . KNEE RECONSTRUCTION Right    x 2  . NASAL SEPTUM SURGERY    . TOTAL KNEE ARTHROPLASTY Right 2007    Current Outpatient Prescriptions  Medication Sig Dispense Refill  . cetirizine (ZYRTEC) 10 MG tablet Take 10 mg by mouth daily.    Marland Kitchen ibuprofen (ADVIL,MOTRIN) 200 MG tablet Take 600 mg by mouth as needed.     Marland Kitchen losartan (COZAAR) 50 MG tablet daily.    Marland Kitchen MELATONIN PO Take by mouth as needed.    . montelukast (SINGULAIR) 10 MG tablet Take 1 tablet by mouth daily.    . simvastatin (ZOCOR) 20 MG tablet Take 1 tablet by mouth daily.    . TOPROL XL 25 MG 24 hr tablet     . Triamcinolone Acetonide (NASACORT AQ NA)  Place into the nose.     No current facility-administered medications for this visit.     Family History  Problem Relation Age of Onset  . Hyperlipidemia Mother   . Hypertension Mother   . Hypertension Father   . Hyperlipidemia Father   . Diabetes Sister   . Hypertension Sister   . Hyperlipidemia Sister   . Hypertension Brother   . Stroke Maternal Grandmother 73  . Heart attack Paternal Grandmother     ROS:  Pertinent items are noted in HPI.  Otherwise, a comprehensive ROS was negative.  Exam:   BP 112/76 (BP Location: Right Arm, Patient Position: Sitting, Cuff Size: Normal)   Pulse 60   Resp 12   Ht 5\' 3"  (1.6 m)   Wt 175 lb 12.8 oz (79.7 kg)   BMI 31.14 kg/m  Height: 5\' 3"  (160 cm) Ht Readings from Last 3 Encounters:  12/04/15 5\' 3"  (1.6 m)  12/02/14 5' 3.25" (1.607 m)  10/21/14 5\' 3"  (1.6 m)    General appearance: alert, cooperative and appears stated age Head: Normocephalic, without obvious abnormality, atraumatic Neck: no adenopathy, supple, symmetrical, trachea midline and thyroid normal to inspection and palpation Lungs: clear  to auscultation bilaterally Breasts: normal appearance, no masses or tenderness, No nipple retraction or dimpling, No nipple discharge or bleeding, No axillary or supraclavicular adenopathy Heart: regular rate and rhythm Abdomen: soft, non-tender; no masses,  no organomegaly Extremities: extremities normal, atraumatic, no cyanosis or edema Skin: Skin color, texture, turgor normal. No rashes or lesions Lymph nodes: Cervical, supraclavicular, and axillary nodes normal. No abnormal inguinal nodes palpated Neurologic: Grossly normal   Pelvic: External genitalia:  no lesions              Urethra:  normal appearing urethra with no masses, tenderness or lesions              Bartholin's and Skene's: normal                 Vagina: normal appearing vagina with normal color and discharge, no lesions              Cervix: no cervical motion  tenderness, no lesions and normal appearance              Pap taken: No. Bimanual Exam:  Uterus:  normal size, contour, position, consistency, mobility, non-tender              Adnexa: normal adnexa and no mass, fullness, tenderness               Rectovaginal: Confirms               Anus:  normal sphincter tone, no lesions  Chaperone present: yes  A:  Well Woman with normal exam  Menopausal no HRT  Vaginal dryness coconut working well to control  History of hematuria, benign  Hypertension/hyperlipidemia with PCP management  Screening labs    P:   Reviewed health and wellness pertinent to exam  Aware of need to evaluate if vaginal bleeding  Reviewed warning signs of UTI  Lab Urine micro  Continue follow up with PCP as indicated  Labs: HIV,Hep C  Pap smear as above not taken    counseled on breast self exam, mammography screening, adequate intake of calcium and vitamin D, diet and exercise return annually or prn  An After Visit Summary was printed and given to the patient.

## 2015-12-04 NOTE — Patient Instructions (Signed)

## 2015-12-05 LAB — HEPATITIS C ANTIBODY: HCV AB: NEGATIVE

## 2015-12-05 LAB — URINALYSIS, MICROSCOPIC ONLY
BACTERIA UA: NONE SEEN [HPF]
Casts: NONE SEEN [LPF]
Crystals: NONE SEEN [HPF]
SQUAMOUS EPITHELIAL / LPF: NONE SEEN [HPF] (ref <=5)
Yeast: NONE SEEN [HPF]

## 2015-12-05 LAB — HIV ANTIBODY (ROUTINE TESTING W REFLEX): HIV 1&2 Ab, 4th Generation: NONREACTIVE

## 2015-12-05 NOTE — Progress Notes (Signed)
Encounter reviewed Culver Feighner, MD   

## 2015-12-09 ENCOUNTER — Telehealth: Payer: Self-pay

## 2015-12-09 NOTE — Telephone Encounter (Signed)
Pt states she will come in for urine only but declines to do weight, pulse or bp. Pt states she has a lot to do & doesn't have time to do it & also feels it is unnecessary since she was just here for appt. Nurse visit scheduled for tomorrow for patient at 2pm. Routing to [facility] for review.

## 2015-12-10 ENCOUNTER — Ambulatory Visit (INDEPENDENT_AMBULATORY_CARE_PROVIDER_SITE_OTHER): Payer: Managed Care, Other (non HMO)

## 2015-12-10 DIAGNOSIS — R319 Hematuria, unspecified: Secondary | ICD-10-CM

## 2015-12-10 LAB — POCT URINALYSIS DIPSTICK
Bilirubin, UA: NEGATIVE
Glucose, UA: NEGATIVE
KETONES UA: NEGATIVE
Leukocytes, UA: NEGATIVE
Nitrite, UA: NEGATIVE
PH UA: 6
PROTEIN UA: NEGATIVE
Urobilinogen, UA: NEGATIVE

## 2015-12-10 NOTE — Progress Notes (Signed)
Patient here for TOC of hematuria in her urine.  Patient feels fine and no complaints.   Patient refused all vitals.   Patient will be out of town this weekend. Can contact her on cell phone.

## 2015-12-11 LAB — URINALYSIS, MICROSCOPIC ONLY
BACTERIA UA: NONE SEEN [HPF]
CRYSTALS: NONE SEEN [HPF]
Casts: NONE SEEN [LPF]
RBC / HPF: NONE SEEN RBC/HPF (ref <=2)
Squamous Epithelial / LPF: NONE SEEN [HPF] (ref <=5)
YEAST: NONE SEEN [HPF]

## 2016-04-08 ENCOUNTER — Other Ambulatory Visit: Payer: Self-pay | Admitting: Family Medicine

## 2016-04-08 DIAGNOSIS — Z1231 Encounter for screening mammogram for malignant neoplasm of breast: Secondary | ICD-10-CM

## 2016-05-11 ENCOUNTER — Ambulatory Visit
Admission: RE | Admit: 2016-05-11 | Discharge: 2016-05-11 | Disposition: A | Discharge status: Home / Self Care | Payer: Managed Care, Other (non HMO) | Source: Ambulatory Visit | Attending: Family Medicine | Admitting: Family Medicine

## 2016-05-11 DIAGNOSIS — Z1231 Encounter for screening mammogram for malignant neoplasm of breast: Secondary | ICD-10-CM

## 2016-08-09 ENCOUNTER — Other Ambulatory Visit: Payer: Self-pay | Admitting: Family Medicine

## 2016-08-09 DIAGNOSIS — M5431 Sciatica, right side: Secondary | ICD-10-CM

## 2016-08-11 ENCOUNTER — Other Ambulatory Visit: Payer: Managed Care, Other (non HMO)

## 2016-12-09 ENCOUNTER — Encounter: Payer: Self-pay | Admitting: Certified Nurse Midwife

## 2016-12-09 ENCOUNTER — Ambulatory Visit (INDEPENDENT_AMBULATORY_CARE_PROVIDER_SITE_OTHER): Payer: Managed Care, Other (non HMO) | Admitting: Certified Nurse Midwife

## 2016-12-09 VITALS — BP 108/68 | HR 68 | Resp 16 | Ht 62.75 in | Wt 180.0 lb

## 2016-12-09 DIAGNOSIS — Z01419 Encounter for gynecological examination (general) (routine) without abnormal findings: Secondary | ICD-10-CM | POA: Diagnosis not present

## 2016-12-09 DIAGNOSIS — R29890 Loss of height: Secondary | ICD-10-CM

## 2016-12-09 DIAGNOSIS — N951 Menopausal and female climacteric states: Secondary | ICD-10-CM | POA: Diagnosis not present

## 2016-12-09 NOTE — Progress Notes (Signed)
61 y.o. G0P0000 Single  Caucasian Fe here for annual exam. Menopausal no HRT. Denies vaginal bleeding or vaginal dryness.Using coconut oil for vaginal dryness with good result. Not sexually active. Sees PCP Dr. Justin Mend yearly for aex/labs/hypertnesion/cholesterol/allergy/ migraine  Headache management.. Diagnosed with diverticulitis earlier in the year now trying to learn how manage with diet changes.. Has now increased walking for weight management, had gained 10 pounds. No health issues today. Planning vacation to the [place]!  No LMP recorded. Patient is postmenopausal.          Sexually active: No.  The current method of family planning is post menopausal status.    Exercising: Yes.    walking Smoker:  no  Health Maintenance: Pap:  12-02-14 neg History of Abnormal Pap: yes MMG:  05-11-16 category b density birads 1:neg Self Breast exams: no Colonoscopy:  2016 neg., pt declines further BMD:   7-8 yrs ago TDaP:  2010 Shingles: no Pneumonia: no Hep C and HIV: both neg 2017 Labs: none   reports that she has quit smoking. She has never used smokeless tobacco. She reports that she drinks alcohol. She reports that she does not use drugs.  Past Medical History:  Diagnosis Date  . Diverticulitis   . Graves' disease without crisis 1997   remission  . History of rheumatic fever age 76  . Hyperlipidemia   . Hypertension     Past Surgical History:  Procedure Laterality Date  . COCCYX REMOVAL     sledding accident  . KNEE RECONSTRUCTION Right    x 2  . NASAL SEPTUM SURGERY    . TOTAL KNEE ARTHROPLASTY Right 2007    Current Outpatient Prescriptions  Medication Sig Dispense Refill  . budesonide (RHINOCORT AQUA) 32 MCG/ACT nasal spray     . cetirizine (ZYRTEC) 10 MG tablet Take 10 mg by mouth daily.    . cyclobenzaprine (FLEXERIL) 10 MG tablet     . ibuprofen (ADVIL,MOTRIN) 200 MG tablet Take 600 mg by mouth as needed.     Marland Kitchen losartan (COZAAR) 50 MG tablet daily.    . montelukast  (SINGULAIR) 10 MG tablet Take 1 tablet by mouth daily.    . simvastatin (ZOCOR) 20 MG tablet Take 1 tablet by mouth daily.    . TOPROL XL 25 MG 24 hr tablet      No current facility-administered medications for this visit.     Family History  Problem Relation Age of Onset  . Hyperlipidemia Mother   . Hypertension Mother   . Hypertension Father   . Hyperlipidemia Father   . Diabetes Sister   . Hypertension Sister   . Hyperlipidemia Sister   . Hypertension Brother   . Stroke Maternal Grandmother 73  . Heart attack Paternal Grandmother     ROS:  Pertinent items are noted in HPI.  Otherwise, a comprehensive ROS was negative.  Exam:   BP 108/68   Pulse 68   Resp 16   Ht 5' 2.75" (1.594 m)   Wt 180 lb (81.6 kg)   BMI 32.14 kg/m  Height: 5' 2.75" (159.4 cm) Ht Readings from Last 3 Encounters:  12/09/16 5' 2.75" (1.594 m)  12/04/15 5\' 3"  (1.6 m)  12/02/14 5' 3.25" (1.607 m)    General appearance: alert, cooperative and appears stated age Head: Normocephalic, without obvious abnormality, atraumatic Neck: no adenopathy, supple, symmetrical, trachea midline and thyroid normal to inspection and palpation Lungs: clear to auscultation bilaterally Breasts: normal appearance, no masses or tenderness, No nipple  retraction or dimpling, No nipple discharge or bleeding, No axillary or supraclavicular adenopathy Heart: regular rate and rhythm Abdomen: soft, non-tender; no masses,  no organomegaly Extremities: extremities normal, atraumatic, no cyanosis or edema Skin: Skin color, texture, turgor normal. No rashes or lesions Lymph nodes: Cervical, supraclavicular, and axillary nodes normal. No abnormal inguinal nodes palpated Neurologic: Grossly normal   Pelvic: External genitalia:  no lesions              Urethra:  normal appearing urethra with no masses, tenderness or lesions              Bartholin's and Skene's: normal                 Vagina: normal appearing vagina with normal  color and discharge, no lesions              Cervix: no cervical motion tenderness, no lesions and nulliparous appearance              Pap taken: No. Bimanual Exam:  Uterus:  normal size, contour, position, consistency, mobility, non-tender and anteverted              Adnexa: normal adnexa and no mass, fullness, tenderness               Rectovaginal: Confirms               Anus:  normal sphincter tone, no lesions  Chaperone present: yes  A:  Well Woman with normal exam  Menopausal no HRT  Vaginal dryness coconut oil working well  Hypertension/cholesterol/migraine headache management with PCP, all stable per patient.  Height loss over 3 years 1/2", ? Last BMD with PCP  P:   Reviewed health and wellness pertinent to exam  Aware of need to evaluate if vaginal bleeding  Will advise if issues with use of coconut oil  Continue follow up with PCP as indicated  Discussed need to have BMD due height change, patient to check with PCP on last one done and will schedule. Need to sign for record today.  Pap smear: no   counseled on breast self exam, mammography screening, feminine hygiene, use and side effects of OCP's, osteoporosis, adequate intake of calcium and vitamin D, diet and exercise  return annually or prn  An After Visit Summary was printed and given to the patient.

## 2016-12-09 NOTE — Patient Instructions (Signed)

## 2016-12-27 ENCOUNTER — Telehealth: Payer: Self-pay | Admitting: *Deleted

## 2016-12-27 DIAGNOSIS — Z780 Asymptomatic menopausal state: Secondary | ICD-10-CM

## 2016-12-27 NOTE — Telephone Encounter (Signed)
-----   Message from [name], MD sent at 12/23/2016  1:00 PM EDT ----- Regarding: bmd Please let pt know BMD results from 10/13/09 showed normal results.  Had height loss noted at [facility] on 12/09/16.  She needs to have repeat BMD done.  Can schedule where she had MMG done.  Thanks.  Vinnie Level

## 2016-12-27 NOTE — Telephone Encounter (Signed)
Spoke with patient, advised as seen below per Dr. Sabra Heck. Order placed for BMD at [facility], patient request to call directly to schedule. Patient verbalizes understanding and is agreeable.  Routing to provider for final review. Patient is agreeable to disposition. Will close encounter.   Cc: Melvia Heaps, CNM

## 2017-01-24 ENCOUNTER — Ambulatory Visit
Admission: RE | Admit: 2017-01-24 | Discharge: 2017-01-24 | Disposition: A | Discharge status: Home / Self Care | Payer: 59 | Source: Ambulatory Visit | Attending: Obstetrics & Gynecology | Admitting: Obstetrics & Gynecology

## 2017-01-24 DIAGNOSIS — Z13820 Encounter for screening for osteoporosis: Secondary | ICD-10-CM | POA: Diagnosis not present

## 2017-01-24 DIAGNOSIS — Z780 Asymptomatic menopausal state: Secondary | ICD-10-CM

## 2017-02-27 DIAGNOSIS — I10 Essential (primary) hypertension: Secondary | ICD-10-CM | POA: Diagnosis not present

## 2017-02-27 DIAGNOSIS — E0590 Thyrotoxicosis, unspecified without thyrotoxic crisis or storm: Secondary | ICD-10-CM | POA: Diagnosis not present

## 2017-02-27 DIAGNOSIS — Z0000 Encounter for general adult medical examination without abnormal findings: Secondary | ICD-10-CM | POA: Diagnosis not present

## 2017-02-27 DIAGNOSIS — E782 Mixed hyperlipidemia: Secondary | ICD-10-CM | POA: Diagnosis not present

## 2017-03-27 ENCOUNTER — Other Ambulatory Visit: Payer: Self-pay | Admitting: Family Medicine

## 2017-03-27 DIAGNOSIS — Z139 Encounter for screening, unspecified: Secondary | ICD-10-CM

## 2017-04-20 DIAGNOSIS — E039 Hypothyroidism, unspecified: Secondary | ICD-10-CM | POA: Diagnosis not present

## 2017-05-12 ENCOUNTER — Ambulatory Visit
Admission: RE | Admit: 2017-05-12 | Discharge: 2017-05-12 | Disposition: A | Discharge status: Home / Self Care | Payer: 59 | Source: Ambulatory Visit | Attending: Family Medicine | Admitting: Family Medicine

## 2017-05-12 DIAGNOSIS — Z1231 Encounter for screening mammogram for malignant neoplasm of breast: Secondary | ICD-10-CM | POA: Diagnosis not present

## 2017-05-12 DIAGNOSIS — Z139 Encounter for screening, unspecified: Secondary | ICD-10-CM

## 2017-05-15 ENCOUNTER — Other Ambulatory Visit: Payer: Self-pay | Admitting: Family Medicine

## 2017-05-15 DIAGNOSIS — Z1231 Encounter for screening mammogram for malignant neoplasm of breast: Secondary | ICD-10-CM

## 2017-06-23 DIAGNOSIS — L719 Rosacea, unspecified: Secondary | ICD-10-CM | POA: Diagnosis not present

## 2017-07-15 DIAGNOSIS — S61432A Puncture wound without foreign body of left hand, initial encounter: Secondary | ICD-10-CM | POA: Diagnosis not present

## 2017-08-29 DIAGNOSIS — D229 Melanocytic nevi, unspecified: Secondary | ICD-10-CM | POA: Diagnosis not present

## 2017-08-29 DIAGNOSIS — L719 Rosacea, unspecified: Secondary | ICD-10-CM | POA: Diagnosis not present

## 2017-09-28 DIAGNOSIS — I10 Essential (primary) hypertension: Secondary | ICD-10-CM | POA: Diagnosis not present

## 2017-12-22 ENCOUNTER — Ambulatory Visit (INDEPENDENT_AMBULATORY_CARE_PROVIDER_SITE_OTHER): Payer: 59 | Admitting: Certified Nurse Midwife

## 2017-12-22 ENCOUNTER — Other Ambulatory Visit (HOSPITAL_COMMUNITY)
Admission: RE | Admit: 2017-12-22 | Discharge: 2017-12-22 | Disposition: A | Discharge status: Home / Self Care | Payer: 59 | Source: Ambulatory Visit | Attending: Certified Nurse Midwife | Admitting: Certified Nurse Midwife

## 2017-12-22 ENCOUNTER — Encounter: Payer: Self-pay | Admitting: Certified Nurse Midwife

## 2017-12-22 ENCOUNTER — Other Ambulatory Visit: Payer: Self-pay

## 2017-12-22 VITALS — BP 120/70 | HR 68 | Resp 16 | Ht 62.75 in | Wt 185.0 lb

## 2017-12-22 DIAGNOSIS — N951 Menopausal and female climacteric states: Secondary | ICD-10-CM

## 2017-12-22 DIAGNOSIS — Z01419 Encounter for gynecological examination (general) (routine) without abnormal findings: Secondary | ICD-10-CM | POA: Diagnosis not present

## 2017-12-22 DIAGNOSIS — Z124 Encounter for screening for malignant neoplasm of cervix: Secondary | ICD-10-CM | POA: Insufficient documentation

## 2017-12-22 DIAGNOSIS — E559 Vitamin D deficiency, unspecified: Secondary | ICD-10-CM | POA: Diagnosis not present

## 2017-12-22 DIAGNOSIS — Z8639 Personal history of other endocrine, nutritional and metabolic disease: Secondary | ICD-10-CM

## 2017-12-22 NOTE — Progress Notes (Signed)
62 y.o. G0P0000 Single  Caucasian Fe here for annual exam. Menopausal no HRT.Denies vaginal bleeding or vaginal dryness. Using coconut oil with good success for dryness. Sees Maurice Small for aex/labs, hypertension,hypothyroid, cholesterol and GERD. All stable at present. No vitamin D lab done. Social stress with job position change (works for [employer]) and caring for sister who is having difficulty. No health issues today.  No LMP recorded. Patient is postmenopausal.          Sexually active: No.  The current method of family planning is post menopausal status.    Exercising: Yes.    walking Smoker:  no  Review of Systems  Constitutional: Negative.   Eyes: Negative.   Respiratory: Negative.   Cardiovascular: Negative.   Gastrointestinal: Negative.   Genitourinary: Negative.   Musculoskeletal: Negative.   Skin: Negative.   Neurological: Negative.   Endo/Heme/Allergies: Negative.   Psychiatric/Behavioral: Negative.     Health Maintenance: Pap:  12-02-14 neg History of Abnormal Pap: yes MMG:  05-12-17 category b density birads 1:neg Self Breast exams: no Colonoscopy:  2016 neg, pt declines further BMD:   2018 TDaP:  2010 Shingles: no Pneumonia: no Hep C and HIV: both neg 2017 Labs: if needed   reports that she has quit smoking. She has never used smokeless tobacco. She reports that she drinks alcohol. She reports that she does not use drugs.  Past Medical History:  Diagnosis Date  . Abnormal Pap smear of cervix    yrs ago  . Diverticulitis   . Graves' disease without crisis 1997   remission  . History of rheumatic fever age 71  . Hyperlipidemia   . Hypertension     Past Surgical History:  Procedure Laterality Date  . CERVICAL CONE BIOPSY     yrs ago  . COCCYX REMOVAL     sledding accident  . KNEE RECONSTRUCTION Right    x 2  . NASAL SEPTUM SURGERY    . TOTAL KNEE ARTHROPLASTY Right 2007    Current Outpatient Medications  Medication Sig Dispense Refill  .  budesonide (RHINOCORT AQUA) 32 MCG/ACT nasal spray     . cetirizine (ZYRTEC) 10 MG tablet Take 10 mg by mouth daily.    . cyclobenzaprine (FLEXERIL) 10 MG tablet     . ibuprofen (ADVIL,MOTRIN) 200 MG tablet Take 600 mg by mouth as needed.     Marland Kitchen losartan (COZAAR) 50 MG tablet daily.    . montelukast (SINGULAIR) 10 MG tablet Take 1 tablet by mouth daily.    . simvastatin (ZOCOR) 20 MG tablet Take 1 tablet by mouth daily.    . TOPROL XL 25 MG 24 hr tablet      No current facility-administered medications for this visit.     Family History  Problem Relation Age of Onset  . Hyperlipidemia Mother   . Hypertension Mother   . Hypertension Father   . Hyperlipidemia Father   . Diabetes Sister   . Hypertension Sister   . Hyperlipidemia Sister   . Hypertension Brother   . Stroke Maternal Grandmother 73  . Heart attack Paternal Grandmother   . Breast cancer Neg Hx     ROS:  Pertinent items are noted in HPI.  Otherwise, a comprehensive ROS was negative.  Exam:   There were no vitals taken for this visit.   Ht Readings from Last 3 Encounters:  12/09/16 5' 2.75" (1.594 m)  12/04/15 5\' 3"  (1.6 m)  12/02/14 5' 3.25" (1.607 m)  General appearance: alert, cooperative and appears stated age Head: Normocephalic, without obvious abnormality, atraumatic Neck: no adenopathy, supple, symmetrical, trachea midline and thyroid normal to inspection and palpation Lungs: clear to auscultation bilaterally Breasts: normal appearance, no masses or tenderness, No nipple retraction or dimpling, No nipple discharge or bleeding, No axillary or supraclavicular adenopathy Heart: regular rate and rhythm Abdomen: soft, non-tender; no masses,  no organomegaly Extremities: extremities normal, atraumatic, no cyanosis or edema Skin: Skin color, texture, turgor normal. No rashes or lesions Lymph nodes: Cervical, supraclavicular, and axillary nodes normal. No abnormal inguinal nodes palpated Neurologic: Grossly  normal   Pelvic: External genitalia:  no lesions, atrophic appearance              Urethra:  normal appearing urethra with no masses, tenderness or lesions              Bartholin's and Skene's: normal                 Vagina: atrophic appearing vagina with normal color and scant discharge, no lesions              Cervix: no cervical motion tenderness, no lesions and normal appearance              Pap taken: Yes.   Bimanual Exam:  Uterus:  normal size, contour, position, consistency, mobility, non-tender and anteverted              Adnexa: normal adnexa and no mass, fullness, tenderness               Rectovaginal: Confirms               Anus:  normal sphincter tone, no lesions  Chaperone present: yes  A:  Well Woman with normal exam   Menopausal no HRT  Atrophic vaginitis responding well to coconut oil use nightly  Vitamin D deficiency ?  Hypertension,cholesterol,hypothyroid,asthma with PCP management  Social stress  P:   Reviewed health and wellness pertinent to exam  Aware to advise if vaginal bleeding  Continue coconut oil as she is doing now and advise if problems  Lab: Vitamin D  Continue follow up with PCP as indicated  Discussed time for self and seeking assistance as needed.  Pap smear: yes   counseled on breast self exam, mammography screening, feminine hygiene, adequate intake of calcium and vitamin D, diet and exercise, Kegel's exercises  return annually or prn  An After Visit Summary was printed and given to the patient.

## 2017-12-22 NOTE — Patient Instructions (Signed)

## 2017-12-23 LAB — VITAMIN D 25 HYDROXY (VIT D DEFICIENCY, FRACTURES): VIT D 25 HYDROXY: 32.7 ng/mL (ref 30.0–100.0)

## 2017-12-26 LAB — CYTOLOGY - PAP
Diagnosis: NEGATIVE
HPV: NOT DETECTED

## 2017-12-28 DIAGNOSIS — Z23 Encounter for immunization: Secondary | ICD-10-CM | POA: Diagnosis not present

## 2018-02-26 DIAGNOSIS — M7542 Impingement syndrome of left shoulder: Secondary | ICD-10-CM | POA: Diagnosis not present

## 2018-02-26 DIAGNOSIS — M25561 Pain in right knee: Secondary | ICD-10-CM | POA: Diagnosis not present

## 2018-03-14 DIAGNOSIS — M25512 Pain in left shoulder: Secondary | ICD-10-CM | POA: Diagnosis not present

## 2018-03-26 DIAGNOSIS — M7542 Impingement syndrome of left shoulder: Secondary | ICD-10-CM | POA: Diagnosis not present

## 2018-03-30 DIAGNOSIS — I10 Essential (primary) hypertension: Secondary | ICD-10-CM | POA: Diagnosis not present

## 2018-03-30 DIAGNOSIS — E039 Hypothyroidism, unspecified: Secondary | ICD-10-CM | POA: Diagnosis not present

## 2018-03-30 DIAGNOSIS — E782 Mixed hyperlipidemia: Secondary | ICD-10-CM | POA: Diagnosis not present

## 2018-03-30 DIAGNOSIS — Z0000 Encounter for general adult medical examination without abnormal findings: Secondary | ICD-10-CM | POA: Diagnosis not present

## 2018-04-01 DIAGNOSIS — L03115 Cellulitis of right lower limb: Secondary | ICD-10-CM | POA: Diagnosis not present

## 2018-04-01 DIAGNOSIS — I10 Essential (primary) hypertension: Secondary | ICD-10-CM | POA: Diagnosis not present

## 2018-04-11 ENCOUNTER — Other Ambulatory Visit: Payer: Self-pay

## 2018-04-11 ENCOUNTER — Encounter (HOSPITAL_COMMUNITY): Payer: Self-pay | Admitting: Emergency Medicine

## 2018-04-11 ENCOUNTER — Emergency Department (HOSPITAL_COMMUNITY): Payer: 59

## 2018-04-11 ENCOUNTER — Emergency Department (HOSPITAL_COMMUNITY)
Admission: EM | Admit: 2018-04-11 | Discharge: 2018-04-11 | Disposition: A | Discharge status: Home / Self Care | Payer: 59 | Attending: Emergency Medicine | Admitting: Emergency Medicine

## 2018-04-11 DIAGNOSIS — Z87891 Personal history of nicotine dependence: Secondary | ICD-10-CM | POA: Diagnosis not present

## 2018-04-11 DIAGNOSIS — R51 Headache: Secondary | ICD-10-CM | POA: Diagnosis not present

## 2018-04-11 DIAGNOSIS — Z79899 Other long term (current) drug therapy: Secondary | ICD-10-CM | POA: Insufficient documentation

## 2018-04-11 DIAGNOSIS — I10 Essential (primary) hypertension: Secondary | ICD-10-CM | POA: Insufficient documentation

## 2018-04-11 DIAGNOSIS — R519 Headache, unspecified: Secondary | ICD-10-CM

## 2018-04-11 LAB — COMPREHENSIVE METABOLIC PANEL
ALK PHOS: 52 U/L (ref 38–126)
ALT: 37 U/L (ref 0–44)
AST: 28 U/L (ref 15–41)
Albumin: 4.2 g/dL (ref 3.5–5.0)
Anion gap: 9 (ref 5–15)
BUN: 23 mg/dL (ref 8–23)
CALCIUM: 9.2 mg/dL (ref 8.9–10.3)
CO2: 27 mmol/L (ref 22–32)
CREATININE: 0.82 mg/dL (ref 0.44–1.00)
Chloride: 104 mmol/L (ref 98–111)
Glucose, Bld: 104 mg/dL — ABNORMAL HIGH (ref 70–99)
Potassium: 4.2 mmol/L (ref 3.5–5.1)
SODIUM: 140 mmol/L (ref 135–145)
Total Bilirubin: 1.5 mg/dL — ABNORMAL HIGH (ref 0.3–1.2)
Total Protein: 7.8 g/dL (ref 6.5–8.1)

## 2018-04-11 LAB — CBC WITH DIFFERENTIAL/PLATELET
ABS IMMATURE GRANULOCYTES: 0.03 10*3/uL (ref 0.00–0.07)
Basophils Absolute: 0 10*3/uL (ref 0.0–0.1)
Basophils Relative: 0 %
Eosinophils Absolute: 0 10*3/uL (ref 0.0–0.5)
Eosinophils Relative: 0 %
HCT: 43.8 % (ref 36.0–46.0)
HEMOGLOBIN: 14.3 g/dL (ref 12.0–15.0)
Immature Granulocytes: 0 %
LYMPHS ABS: 1 10*3/uL (ref 0.7–4.0)
LYMPHS PCT: 11 %
MCH: 31.1 pg (ref 26.0–34.0)
MCHC: 32.6 g/dL (ref 30.0–36.0)
MCV: 95.2 fL (ref 80.0–100.0)
MONO ABS: 0.4 10*3/uL (ref 0.1–1.0)
MONOS PCT: 4 %
NEUTROS ABS: 7.8 10*3/uL — AB (ref 1.7–7.7)
Neutrophils Relative %: 85 %
Platelets: 256 10*3/uL (ref 150–400)
RBC: 4.6 MIL/uL (ref 3.87–5.11)
RDW: 12.5 % (ref 11.5–15.5)
WBC: 9.3 10*3/uL (ref 4.0–10.5)
nRBC: 0 % (ref 0.0–0.2)

## 2018-04-11 MED ORDER — ACETAMINOPHEN 500 MG PO TABS
1000.0000 mg | ORAL_TABLET | Freq: Once | ORAL | Status: AC
  Administered 2018-04-11: 1000 mg via ORAL
  Filled 2018-04-11: qty 2

## 2018-04-11 MED ORDER — LABETALOL HCL 5 MG/ML IV SOLN
10.0000 mg | Freq: Once | INTRAVENOUS | Status: AC
  Administered 2018-04-11: 10 mg via INTRAVENOUS
  Filled 2018-04-11: qty 4

## 2018-04-11 NOTE — ED Provider Notes (Signed)
[facility] DEPT Provider Note   CSN: [id number] Arrival date & time: 04/11/18  1054     History   Chief Complaint Chief Complaint  Patient presents with  . Hypertension  . Headache    HPI Holly Beard is a 63 y.o. female ending for evaluation of headache and hypertension.  Patient states that she woke up with a left-sided throbbing headache.  It has been constant since.  She went to check her blood pressure, and is elevated today, between the 956L and 875 systolic, and around 64-332 diastolic.  This is very high for her.  Patient states she did take her home medication as prescribed, both the Toprol and Cozaar.  She took an extra half of metoprolol when her blood pressure was not improving, but this did not change her blood pressure.  She denies vision changes, slurred speech, numbness/tingling, weakness, chest pain, shortness of breath, nausea, vomiting, abdominal pain, urinary symptoms, normal bowel movements.  Patient states she has a history of allergies, high cholesterol, and left shoulder pain.  She has been having nasal congestion/drainage recently, but no other cold symptoms.  HPI  Past Medical History:  Diagnosis Date  . Abnormal Pap smear of cervix    yrs ago  . Diverticulitis   . Graves' disease without crisis 1997   remission  . History of rheumatic fever age 16  . Hyperlipidemia   . Hypertension     Patient Active Problem List   Diagnosis Date Noted  . Unspecified essential hypertension 11/22/2013    Class: History of  . Other and unspecified hyperlipidemia 11/22/2013    Class: History of    Past Surgical History:  Procedure Laterality Date  . CERVICAL CONE BIOPSY     yrs ago  . COCCYX REMOVAL     sledding accident  . KNEE RECONSTRUCTION Right    x 2  . NASAL SEPTUM SURGERY    . TOTAL KNEE ARTHROPLASTY Right 2007     OB History    Gravida  0   Para  0   Term  0   Preterm  0   AB  0   Living  0     SAB    0   TAB  0   Ectopic  0   Multiple  0   Live Births               Home Medications    Prior to Admission medications   Medication Sig Start Date End Date Taking? Authorizing Provider  cetirizine (ZYRTEC) 10 MG tablet Take 10 mg by mouth daily.    [provider]  cyclobenzaprine (FLEXERIL) 10 MG tablet  04/15/16   [provider]  ibuprofen (ADVIL,MOTRIN) 200 MG tablet Take 600 mg by mouth as needed.     [provider]  levothyroxine (SYNTHROID, LEVOTHROID) 25 MCG tablet TK 1 T PO QD IN THE MORNING OES 12/16/17   [provider]  losartan (COZAAR) 100 MG tablet TK 1 T PO ONCE A DAY 12/16/17   [provider]  metoprolol succinate (TOPROL-XL) 100 MG 24 hr tablet TK 1 T PO ONCE D 12/18/17   [provider]  metroNIDAZOLE (METROGEL) 0.75 % gel APP AA ON FACE BID 09/22/17   [provider]  montelukast (SINGULAIR) 10 MG tablet Take 1 tablet by mouth daily. 11/14/13   [provider]  simvastatin (ZOCOR) 20 MG tablet Take 1 tablet by mouth daily. 11/14/13  [provider]    Family History Family History  Problem Relation Age of Onset  . Hyperlipidemia Mother   . Hypertension Mother   . Hypertension Father   . Hyperlipidemia Father   . Diabetes Sister   . Hypertension Sister   . Hyperlipidemia Sister   . Hypertension Brother   . Stroke Maternal Grandmother 73  . Heart attack Paternal Grandmother   . Breast cancer Neg Hx     Social History Social History   Tobacco Use  . Smoking status: Former Research scientist (life sciences)  . Smokeless tobacco: Never Used  Substance Use Topics  . Alcohol use: Yes    Alcohol/week: 0.0 - 10.0 standard drinks  . Drug use: No     Allergies   Morphine and related and Sulfa antibiotics   Review of Systems Review of Systems  Neurological: Positive for headaches.  All other systems reviewed and are negative.    Physical Exam Updated Vital Signs BP (!) 141/76   Pulse 71   Temp  98.7 F (37.1 C)   Resp 18   Ht 5\' 3"  (1.6 m)   Wt 83.8 kg   SpO2 94%   BMI 32.74 kg/m   Physical Exam Vitals signs and nursing note reviewed.  Constitutional:      General: She is not in acute distress.    Appearance: She is well-developed.     Comments: Sitting comfortably in NAD  HENT:     Head: Normocephalic and atraumatic.     Comments: OP clear without tonsillar swelling or exudate.  Uvula midline with ago palate rise.  TMs nonerythematous nonbulging bilaterally. No nasal congestion Eyes:     Extraocular Movements: Extraocular movements intact.     Conjunctiva/sclera: Conjunctivae normal.     Pupils: Pupils are equal, round, and reactive to light.     Comments: EOMI and PERRLA. No nystagmus  Neck:     Musculoskeletal: Normal range of motion and neck supple.  Cardiovascular:     Rate and Rhythm: Normal rate and regular rhythm.     Pulses: Normal pulses.  Pulmonary:     Effort: Pulmonary effort is normal. No respiratory distress.     Breath sounds: Normal breath sounds. No wheezing.  Abdominal:     General: There is no distension.     Palpations: Abdomen is soft.     Tenderness: There is no abdominal tenderness.  Musculoskeletal: Normal range of motion.     Comments: Strength intact x4. Sensation intact x4.   Skin:    General: Skin is warm and dry.     Capillary Refill: Capillary refill takes less than 2 seconds.  Neurological:     General: No focal deficit present.     Mental Status: She is alert and oriented to person, place, and time.     Cranial Nerves: No cranial nerve deficit.     Sensory: No sensory deficit.     Motor: No weakness.     Comments: No neuro deficits. CN intact. Nose to finger intact. Fine movement and coordination intact. Negative pronator drift.       ED Treatments / Results  Labs (all labs ordered are listed, but only abnormal results are displayed) Labs Reviewed  CBC WITH DIFFERENTIAL/PLATELET - Abnormal; Notable for the following  components:      Result Value   Neutro Abs 7.8 (*)    All other components within normal limits  COMPREHENSIVE METABOLIC PANEL - Abnormal; Notable for the following components:   Glucose, Bld  104 (*)    Total Bilirubin 1.5 (*)    All other components within normal limits    EKG EKG Interpretation  Date/Time:  Wednesday April 11 2018 11:40:28 EST Ventricular Rate:  88 PR Interval:    QRS Duration: 95 QT Interval:  362 QTC Calculation: 438 R Axis:   -23 Text Interpretation:  Sinus rhythm Borderline left axis deviation Low voltage, precordial leads Abnormal R-wave progression, late transition Borderline T wave abnormalities No significant change since last tracing Confirmed by Dorie Rank [phone]) on 04/11/2018 2:32:46 PM   Radiology Ct Head Wo Contrast  Result Date: 04/11/2018 CLINICAL DATA:  Hypertension on relief by medication. EXAM: CT HEAD WITHOUT CONTRAST TECHNIQUE: Contiguous axial images were obtained from the base of the skull through the vertex without intravenous contrast. COMPARISON:  None. FINDINGS: Brain: No evidence of acute infarction, hemorrhage, hydrocephalus, extra-axial collection or mass lesion/mass effect. There is chronic diffuse atrophy. Vascular: No hyperdense vessel or unexpected calcification. Skull: Normal. Negative for fracture or focal lesion. Sinuses/Orbits: Mild mucoperiosteal thickening bilateral ethmoid sinuses are noted. The orbits are normal. Other: None. IMPRESSION: No focal acute intracranial abnormality identified. Electronically Signed   By: Abelardo Diesel M.D.   On: 04/11/2018 15:33    Procedures Procedures (including critical care time)  Medications Ordered in ED Medications  labetalol (NORMODYNE,TRANDATE) injection 10 mg (10 mg Intravenous Given 04/11/18 1726)  acetaminophen (TYLENOL) tablet 1,000 mg (1,000 mg Oral Given 04/11/18 1725)     Initial Impression / Assessment and Plan / ED Course  I have reviewed the triage vital signs and the  nursing notes.  Pertinent labs & imaging results that were available during my care of the patient were reviewed by me and considered in my medical decision making (see chart for details).     Pt presenting for evaluation of headache and hypertension.  Physical exam reassuring, no obvious neurologic deficits.  Low suspicion that hypertension is causing her headache, and low suspicion for endorgan damage considering blood pressure is not that elevated.  However, considering she is having a persistent and acute throbbing headache not improving with her blood pressure medicines, will obtain CT head for further evaluation.  Will order basic labs and give round of labetalol for blood pressure control.  Labs reassuring, creatinine stable.  Hemoglobin stable.  Electrolytes stable.  EKG unchanged from previous.  CT head without acute or concerning findings.  Blood pressure improved.  Headache improving with Tylenol and blood pressure control.  Discussed findings with patient.  Discussed importance of follow-up with PCP for further evaluation.  Discussed signs of stroke or endorgan damage.  At this time, patient appears safe for discharge.  Return precautions given.  Patient states she understands and agrees plan.   Final Clinical Impressions(s) / ED Diagnoses   Final diagnoses:  Hypertension, unspecified type  Acute nonintractable headache, unspecified headache type    ED Discharge Orders    None       Franchot Heidelberg, PA-C 04/11/18 2328    Dorie Rank, MD 04/14/18 [phone]

## 2018-04-11 NOTE — ED Triage Notes (Signed)
Pt complaint of hypertension unrelieved by blood pressure medications this morning; headache; denies other.

## 2018-04-11 NOTE — Discharge Instructions (Addendum)
Continue taking home medications as prescribed. Use Tylenol as needed for pain. Follow-up with your primary care doctor for further evaluation of your blood pressure. Return to the emergency room if you develop vision loss, slurred speech, numbness/weakness, or any new, worsening, concerning symptoms.

## 2018-04-13 DIAGNOSIS — I10 Essential (primary) hypertension: Secondary | ICD-10-CM | POA: Diagnosis not present

## 2018-04-17 ENCOUNTER — Other Ambulatory Visit: Payer: Self-pay

## 2018-04-17 DIAGNOSIS — R432 Parageusia: Secondary | ICD-10-CM | POA: Insufficient documentation

## 2018-04-17 DIAGNOSIS — Z87891 Personal history of nicotine dependence: Secondary | ICD-10-CM | POA: Diagnosis not present

## 2018-04-17 DIAGNOSIS — J3089 Other allergic rhinitis: Secondary | ICD-10-CM | POA: Diagnosis not present

## 2018-04-23 DIAGNOSIS — M1611 Unilateral primary osteoarthritis, right hip: Secondary | ICD-10-CM | POA: Diagnosis not present

## 2018-05-14 ENCOUNTER — Ambulatory Visit
Admission: RE | Admit: 2018-05-14 | Discharge: 2018-05-14 | Disposition: A | Discharge status: Home / Self Care | Payer: 59 | Source: Ambulatory Visit | Attending: Family Medicine | Admitting: Family Medicine

## 2018-05-14 DIAGNOSIS — Z1231 Encounter for screening mammogram for malignant neoplasm of breast: Secondary | ICD-10-CM | POA: Diagnosis not present

## 2018-05-28 ENCOUNTER — Other Ambulatory Visit: Payer: Self-pay | Admitting: Orthopaedic Surgery

## 2018-05-28 DIAGNOSIS — I10 Essential (primary) hypertension: Secondary | ICD-10-CM | POA: Diagnosis not present

## 2018-06-18 DIAGNOSIS — M1611 Unilateral primary osteoarthritis, right hip: Secondary | ICD-10-CM | POA: Diagnosis not present

## 2018-06-22 ENCOUNTER — Other Ambulatory Visit: Payer: Self-pay | Admitting: Orthopaedic Surgery

## 2018-06-26 ENCOUNTER — Inpatient Hospital Stay: Admit: 2018-06-26 | Payer: Self-pay | Admitting: Orthopaedic Surgery

## 2018-06-26 SURGERY — ARTHROPLASTY, HIP, TOTAL, ANTERIOR APPROACH
Anesthesia: Spinal | Laterality: Right

## 2018-07-17 ENCOUNTER — Ambulatory Visit: Admit: 2018-07-17 | Payer: 59 | Admitting: Orthopaedic Surgery

## 2018-07-17 SURGERY — ARTHROPLASTY, HIP, TOTAL, ANTERIOR APPROACH
Anesthesia: Spinal | Laterality: Right

## 2018-07-23 DIAGNOSIS — M1611 Unilateral primary osteoarthritis, right hip: Secondary | ICD-10-CM | POA: Diagnosis not present

## 2018-08-15 ENCOUNTER — Other Ambulatory Visit: Payer: Self-pay | Admitting: Orthopaedic Surgery

## 2018-08-16 ENCOUNTER — Other Ambulatory Visit: Payer: Self-pay | Admitting: Orthopaedic Surgery

## 2018-08-17 ENCOUNTER — Other Ambulatory Visit (HOSPITAL_COMMUNITY)
Admission: RE | Admit: 2018-08-17 | Discharge: 2018-08-17 | Disposition: A | Discharge status: Home / Self Care | Payer: 59 | Source: Ambulatory Visit | Attending: Orthopaedic Surgery | Admitting: Orthopaedic Surgery

## 2018-08-17 ENCOUNTER — Other Ambulatory Visit: Payer: Self-pay

## 2018-08-17 ENCOUNTER — Encounter (HOSPITAL_COMMUNITY)
Admission: RE | Admit: 2018-08-17 | Discharge: 2018-08-17 | Disposition: A | Discharge status: Home / Self Care | Payer: 59 | Source: Ambulatory Visit | Attending: Orthopaedic Surgery | Admitting: Orthopaedic Surgery

## 2018-08-17 ENCOUNTER — Encounter (HOSPITAL_COMMUNITY): Payer: Self-pay

## 2018-08-17 DIAGNOSIS — E039 Hypothyroidism, unspecified: Secondary | ICD-10-CM | POA: Diagnosis not present

## 2018-08-17 DIAGNOSIS — Z87891 Personal history of nicotine dependence: Secondary | ICD-10-CM | POA: Insufficient documentation

## 2018-08-17 DIAGNOSIS — Z1159 Encounter for screening for other viral diseases: Secondary | ICD-10-CM | POA: Insufficient documentation

## 2018-08-17 DIAGNOSIS — M1611 Unilateral primary osteoarthritis, right hip: Secondary | ICD-10-CM | POA: Diagnosis not present

## 2018-08-17 DIAGNOSIS — Z96651 Presence of right artificial knee joint: Secondary | ICD-10-CM | POA: Diagnosis not present

## 2018-08-17 DIAGNOSIS — I10 Essential (primary) hypertension: Secondary | ICD-10-CM | POA: Insufficient documentation

## 2018-08-17 DIAGNOSIS — Z01818 Encounter for other preprocedural examination: Secondary | ICD-10-CM | POA: Insufficient documentation

## 2018-08-17 DIAGNOSIS — Z79890 Hormone replacement therapy (postmenopausal): Secondary | ICD-10-CM | POA: Insufficient documentation

## 2018-08-17 DIAGNOSIS — E785 Hyperlipidemia, unspecified: Secondary | ICD-10-CM | POA: Diagnosis not present

## 2018-08-17 DIAGNOSIS — Z79899 Other long term (current) drug therapy: Secondary | ICD-10-CM | POA: Diagnosis not present

## 2018-08-17 HISTORY — DX: Unspecified osteoarthritis, unspecified site: M19.90

## 2018-08-17 HISTORY — DX: Hypothyroidism, unspecified: E03.9

## 2018-08-17 HISTORY — DX: Family history of other specified conditions: Z84.89

## 2018-08-17 HISTORY — DX: Pneumonia, unspecified organism: J18.9

## 2018-08-17 HISTORY — DX: Thyrotoxicosis with diffuse goiter without thyrotoxic crisis or storm: E05.00

## 2018-08-17 LAB — CBC
HCT: 41.2 % (ref 36.0–46.0)
Hemoglobin: 13.8 g/dL (ref 12.0–15.0)
MCH: 31.5 pg (ref 26.0–34.0)
MCHC: 33.5 g/dL (ref 30.0–36.0)
MCV: 94.1 fL (ref 80.0–100.0)
Platelets: 233 10*3/uL (ref 150–400)
RBC: 4.38 MIL/uL (ref 3.87–5.11)
RDW: 12.1 % (ref 11.5–15.5)
WBC: 5.2 10*3/uL (ref 4.0–10.5)
nRBC: 0 % (ref 0.0–0.2)

## 2018-08-17 LAB — SURGICAL PCR SCREEN
MRSA, PCR: NEGATIVE
Staphylococcus aureus: POSITIVE — AB

## 2018-08-17 LAB — BASIC METABOLIC PANEL
Anion gap: 10 (ref 5–15)
BUN: 26 mg/dL — ABNORMAL HIGH (ref 8–23)
CO2: 24 mmol/L (ref 22–32)
Calcium: 9.3 mg/dL (ref 8.9–10.3)
Chloride: 105 mmol/L (ref 98–111)
Creatinine, Ser: 0.86 mg/dL (ref 0.44–1.00)
GFR calc Af Amer: >60 mL/min (ref >60)
GFR calc non Af Amer: >60 mL/min (ref >60)
Glucose, Bld: 88 mg/dL (ref 70–99)
Potassium: 4.4 mmol/L (ref 3.5–5.1)
Sodium: 139 mmol/L (ref 135–145)

## 2018-08-17 LAB — ABO/RH: ABO/RH(D): A POS

## 2018-08-17 NOTE — Progress Notes (Signed)
SPOKE W/  _patient     SCREENING SYMPTOMS OF COVID 19:   COUGH--no  RUNNY NOSE--- no  SORE THROAT---no  NASAL CONGESTION----no  SNEEZING----no  SHORTNESS OF BREATH---no  DIFFICULTY BREATHING---no  TEMP >100.0 -----no  UNEXPLAINED BODY ACHES------no  CHILLS --------no   HEADACHES ---------no  LOSS OF SMELL/ TASTE --------no    HAVE YOU OR ANY FAMILY MEMBER TRAVELLED PAST 14 DAYS OUT OF THE   COUNTY---no STATE----no COUNTRY----no  HAVE YOU OR ANY FAMILY MEMBER BEEN EXPOSED TO ANYONE WITH COVID 19? no    

## 2018-08-17 NOTE — Progress Notes (Signed)
Patient given instructions to [facility] for [place] 19 testingntoday.

## 2018-08-17 NOTE — Patient Instructions (Signed)
Holly Beard  08/17/2018   Your procedure is scheduled on: 08/21/2018   Report to [facility] Main  Entrance  Report to admitting at    0800 AM    Call this number if you have problems the morning of surgery [phone]   Remember: Do not eat food or drink liquids :After Midnight. BRUSH YOUR TEETH MORNING OF SURGERY AND RINSE YOUR MOUTH OUT, NO CHEWING GUM CANDY OR MINTS.     Take these medicines the morning of surgery with A SIP OF WATER:                                 You may not have any metal on your body including hair pins and              piercings  Do not wear jewelry, make-up, lotions, powders or perfumes, deodorant             Do not wear nail polish.  Do not shave  48 hours prior to surgery.               Do not bring valuables to the hospital. [place].  Contacts, dentures or bridgework may not be worn into surgery.  Leave suitcase in the car. After surgery it may be brought to your room.                      Please read over the following fact sheets you were given: _____________________________________________________________________             [facility] - Preparing for Surgery Before surgery, you can play an important role.  Because skin is not sterile, your skin needs to be as free of germs as possible.  You can reduce the number of germs on your skin by washing with CHG (chlorahexidine gluconate) soap before surgery.  CHG is an antiseptic cleaner which kills germs and bonds with the skin to continue killing germs even after washing. Please DO NOT use if you have an allergy to CHG or antibacterial soaps.  If your skin becomes reddened/irritated stop using the CHG and inform your nurse when you arrive at Short Stay. Do not shave (including legs and underarms) for at least 48 hours prior to the first CHG shower.  You may shave your face/neck. Please follow these instructions  carefully:  1.  Shower with CHG Soap the night before surgery and the  morning of Surgery.  2.  If you choose to wash your hair, wash your hair first as usual with your  normal  shampoo.  3.  After you shampoo, rinse your hair and body thoroughly to remove the  shampoo.                           4.  Use CHG as you would any other liquid soap.  You can apply chg directly  to the skin and wash                       Gently with a scrungie or clean washcloth.  5.  Apply the CHG Soap to your body ONLY FROM THE NECK  DOWN.   Do not use on face/ open                           Wound or open sores. Avoid contact with eyes, ears mouth and genitals (private parts).                       Wash face,  Genitals (private parts) with your normal soap.             6.  Wash thoroughly, paying special attention to the area where your surgery  will be performed.  7.  Thoroughly rinse your body with warm water from the neck down.  8.  DO NOT shower/wash with your normal soap after using and rinsing off  the CHG Soap.                9.  Pat yourself dry with a clean towel.            10.  Wear clean pajamas.            11.  Place clean sheets on your bed the night of your first shower and do not  sleep with pets. Day of Surgery : Do not apply any lotions/deodorants the morning of surgery.  Please wear clean clothes to the hospital/surgery center.  FAILURE TO FOLLOW THESE INSTRUCTIONS MAY RESULT IN THE CANCELLATION OF YOUR SURGERY PATIENT SIGNATURE_________________________________  NURSE SIGNATURE__________________________________  ________________________________________________________________________

## 2018-08-18 LAB — NOVEL CORONAVIRUS, NAA (HOSP ORDER, SEND-OUT TO REF LAB; TAT 18-24 HRS): SARS-CoV-2, NAA: NOT DETECTED

## 2018-08-20 ENCOUNTER — Other Ambulatory Visit: Payer: Self-pay | Admitting: Orthopaedic Surgery

## 2018-08-20 NOTE — Progress Notes (Signed)
SPOKE W/  _patient     SCREENING SYMPTOMS OF COVID 19:   COUGH--no RUNNY NOSE--- no  SORE THROAT---no  NASAL CONGESTION----no  SNEEZING----no  SHORTNESS OF BREATH---no  DIFFICULTY BREATHING---no  TEMP >100.0 -----no  UNEXPLAINED BODY ACHES------no  CHILLS -------no-   HEADACHES ---------no  LOSS OF SMELL/ TASTE --------no    HAVE YOU OR ANY FAMILY MEMBER TRAVELLED PAST 14 DAYS OUT OF THE   COUNTY--- STATE----no COUNTRY----no  HAVE YOU OR ANY FAMILY MEMBER BEEN EXPOSED TO ANYONE WITH COVID 19? no

## 2018-08-20 NOTE — H&P (Signed)
TOTAL HIP ADMISSION H&P  Patient is admitted for right total hip arthroplasty.  Subjective:  Chief Complaint: right hip pain  HPI: Holly Beard, 63 y.o. female, has a history of pain and functional disability in the right hip(s) due to arthritis and patient has failed non-surgical conservative treatments for greater than 12 weeks to include NSAID's and/or analgesics, corticosteriod injections, flexibility and strengthening excercises, supervised PT with diminished ADL's post treatment, use of assistive devices, weight reduction as appropriate and activity modification.  Onset of symptoms was gradual starting 5 years ago with gradually worsening course since that time.The patient noted no past surgery on the right hip(s).  Patient currently rates pain in the right hip at 10 out of 10 with activity. Patient has night pain, worsening of pain with activity and weight bearing, trendelenberg gait, pain that interfers with activities of daily living and crepitus. Patient has evidence of subchondral cysts, subchondral sclerosis, periarticular osteophytes and joint space narrowing by imaging studies. This condition presents safety issues increasing the risk of falls. There is no current active infection.  Patient Active Problem List   Diagnosis Date Noted  . Unspecified essential hypertension 11/22/2013    Class: History of  . Other and unspecified hyperlipidemia 11/22/2013    Class: History of   Past Medical History:  Diagnosis Date  . Abnormal Pap smear of cervix    yrs ago  . Arthritis   . Diverticulitis   . Family history of adverse reaction to anesthesia    sister- gets wild   . Graves disease    in remission   . Graves' disease without crisis 1997   remission  . History of rheumatic fever age 2  . Hyperlipidemia   . Hypertension   . Hypothyroidism   . Pneumonia    hx  of years ago     Past Surgical History:  Procedure Laterality Date  . CERVICAL CONE BIOPSY     yrs ago  .  COCCYX REMOVAL     sledding accident  . KNEE RECONSTRUCTION Right    x 2  . NASAL SEPTUM SURGERY    . TOTAL KNEE ARTHROPLASTY Right 2007    No current facility-administered medications for this encounter.    Current Outpatient Medications  Medication Sig Dispense Refill Last Dose  . amLODipine (NORVASC) 5 MG tablet Take 5 mg by mouth daily.     . calcium carbonate (TUMS - DOSED IN MG ELEMENTAL CALCIUM) 500 MG chewable tablet Chew 2-4 tablets by mouth at bedtime as needed for indigestion or heartburn.     . cetirizine (ZYRTEC) 10 MG tablet Take 10 mg by mouth at bedtime.    Taking  . cholecalciferol (VITAMIN D3) 25 MCG (1000 UT) tablet Take 1,000 Units by mouth daily.     Marland Kitchen ibuprofen (ADVIL,MOTRIN) 200 MG tablet Take 400 mg by mouth every 8 (eight) hours as needed for moderate pain.    Taking  . levothyroxine (SYNTHROID, LEVOTHROID) 25 MCG tablet Take 25 mcg by mouth daily before breakfast.   11 Taking  . losartan (COZAAR) 100 MG tablet Take 100 mg by mouth daily.   3 Taking  . Menthol, Topical Analgesic, (BIOFREEZE EX) Apply 1 application topically daily as needed (hip pain).     . metoprolol succinate (TOPROL-XL) 100 MG 24 hr tablet Take 100 mg by mouth daily.   0 Taking  . metroNIDAZOLE (METROGEL) 0.75 % gel Apply 1 application topically daily as needed (rosacea).   11 Taking  .  montelukast (SINGULAIR) 10 MG tablet Take 10 mg by mouth at bedtime.    Taking  . oxyCODONE-acetaminophen (PERCOCET/ROXICET) 5-325 MG tablet Take 1 tablet by mouth 3 (three) times daily as needed for pain.     [name] Glycol-Propyl Glycol (SYSTANE OP) Place 1 drop into both eyes daily as needed (dry eyes).     . simvastatin (ZOCOR) 20 MG tablet Take 20 mg by mouth at bedtime.    Taking   Allergies  Allergen Reactions  . Morphine And Related Itching  . Sulfa Antibiotics Rash    Social History   Tobacco Use  . Smoking status: Former Research scientist (life sciences)  . Smokeless tobacco: Never Used  Substance Use Topics  .  Alcohol use: Yes    Alcohol/week: 0.0 - 10.0 standard drinks    Comment: 5 drinks per week     Family History  Problem Relation Age of Onset  . Hyperlipidemia Mother   . Hypertension Mother   . Hypertension Father   . Hyperlipidemia Father   . Diabetes Sister   . Hypertension Sister   . Hyperlipidemia Sister   . Hypertension Brother   . Stroke Maternal Grandmother 73  . Heart attack Paternal Grandmother   . Breast cancer Neg Hx      Review of Systems  Musculoskeletal: Positive for joint pain.       Right hip  All other systems reviewed and are negative.   Objective:  Physical Exam  Constitutional: She is oriented to person, place, and time. She appears well-developed and well-nourished.  HENT:  Head: Normocephalic and atraumatic.  Eyes: Pupils are equal, round, and reactive to light.  Neck: Normal range of motion.  Cardiovascular: Normal rate and regular rhythm.  Respiratory: Effort normal.  GI: Soft.  Musculoskeletal:     Comments: Right hip motion is limited and extremely painful in internal rotation.  Straight leg raise is negative.  She walks with an altered gait.  Sensation and motor function are intact distally with palpable pulses in her feet.    Neurological: She is alert and oriented to person, place, and time.  Skin: Skin is warm and dry.  Psychiatric: She has a normal mood and affect. Her behavior is normal. Judgment and thought content normal.    Vital signs in last 24 hours:    Labs:   Estimated body mass index is 32.77 kg/m as calculated from the following:   Height as of 08/17/18: 5\' 3"  (1.6 m).   Weight as of 08/17/18: 83.9 kg.   Imaging Review Plain radiographs demonstrate severe degenerative joint disease of the right hip(s). The bone quality appears to be good for age and reported activity level.      Assessment/Plan:  End stage primary arthritis, right hip(s)  The patient history, physical examination, clinical judgement of the  provider and imaging studies are consistent with end stage degenerative joint disease of the right hip(s) and total hip arthroplasty is deemed medically necessary. The treatment options including medical management, injection therapy, arthroscopy and arthroplasty were discussed at length. The risks and benefits of total hip arthroplasty were presented and reviewed. The risks due to aseptic loosening, infection, stiffness, dislocation/subluxation,  thromboembolic complications and other imponderables were discussed.  The patient acknowledged the explanation, agreed to proceed with the plan and consent was signed. Patient is being admitted for inpatient treatment for surgery, pain control, PT, OT, prophylactic antibiotics, VTE prophylaxis, progressive ambulation and ADL's and discharge planning.The patient is planning to be discharged home with home  health services

## 2018-08-20 NOTE — Progress Notes (Signed)
Anesthesia Chart Review   Case:  [id number] Date/Time:  08/21/18 1007   Procedure:  TOTAL HIP ARTHROPLASTY ANTERIOR APPROACH (Right )   Anesthesia type:  Spinal   Pre-op diagnosis:  RIGHT HIP DEGENERATIVE JOINT DISEASE   Location:  [facility] ROOM 07 / [facility]   Surgeon:  Melrose Nakayama, MD      DISCUSSION: 63 yo former smoker with h/o HTN, HLD, hypothyroidism, Grave disease (in remission), right hip DJD scheduled for above procedure 08/21/18 with Dr. Melrose Nakayama.   Negative COVID19 nasopharyngeal swab 08/17/2018.   Pt can proceed with planned procedure barring acute status change.  VS: BP 130/63   Pulse 68   Temp 37.1 C (Oral)   Resp 16   Ht 5\' 3"  (1.6 m)   Wt 83.9 kg   SpO2 99%   BMI 32.77 kg/m   PROVIDERS: Maurice Small, MD is PCP    LABS: Labs reviewed: Acceptable for surgery. (all labs ordered are listed, but only abnormal results are displayed)  Labs Reviewed  SURGICAL PCR SCREEN - Abnormal; Notable for the following components:      Result Value   Staphylococcus aureus POSITIVE (*)    All other components within normal limits  BASIC METABOLIC PANEL - Abnormal; Notable for the following components:   BUN 26 (*)    All other components within normal limits  CBC  TYPE AND SCREEN  ABO/RH     IMAGES:   EKG: 04/17/2018 Rate 89 bpm Sinus rhythm  Prolonged PR interval Low voltage, precordial leads Abnormal R-wave progression, early transition  CV:  Past Medical History:  Diagnosis Date  . Abnormal Pap smear of cervix    yrs ago  . Arthritis   . Diverticulitis   . Family history of adverse reaction to anesthesia    sister- gets wild   . Graves disease    in remission   . Graves' disease without crisis 1997   remission  . History of rheumatic fever age 1  . Hyperlipidemia   . Hypertension   . Hypothyroidism   . Pneumonia    hx  of years ago     Past Surgical History:  Procedure Laterality Date  . CERVICAL CONE BIOPSY     yrs ago  . COCCYX REMOVAL      sledding accident  . KNEE RECONSTRUCTION Right    x 2  . NASAL SEPTUM SURGERY    . TOTAL KNEE ARTHROPLASTY Right 2007    MEDICATIONS: . amLODipine (NORVASC) 5 MG tablet  . calcium carbonate (TUMS - DOSED IN MG ELEMENTAL CALCIUM) 500 MG chewable tablet  . cetirizine (ZYRTEC) 10 MG tablet  . cholecalciferol (VITAMIN D3) 25 MCG (1000 UT) tablet  . ibuprofen (ADVIL,MOTRIN) 200 MG tablet  . levothyroxine (SYNTHROID, LEVOTHROID) 25 MCG tablet  . losartan (COZAAR) 100 MG tablet  . Menthol, Topical Analgesic, (BIOFREEZE EX)  . metoprolol succinate (TOPROL-XL) 100 MG 24 hr tablet  . metroNIDAZOLE (METROGEL) 0.75 % gel  . montelukast (SINGULAIR) 10 MG tablet  . oxyCODONE-acetaminophen (PERCOCET/ROXICET) 5-325 MG tablet  . Polyethyl Glycol-Propyl Glycol (SYSTANE OP)  . simvastatin (ZOCOR) 20 MG tablet   No current facility-administered medications for this encounter.    Maia Plan [facility] Pre-Surgical Testing [phone] 08/20/18 10:45 AM

## 2018-08-20 NOTE — Anesthesia Preprocedure Evaluation (Addendum)
Anesthesia Evaluation  Patient identified by MRN, date of birth, ID band Patient awake    Reviewed: Allergy & Precautions, H&P , NPO status , Patient's Chart, lab work & pertinent test results, reviewed documented beta blocker date and time   History of Anesthesia Complications (+) Family history of anesthesia reaction  Airway Mallampati: II  TM Distance: >3 FB Neck ROM: full    Dental no notable dental hx. (+) Teeth Intact   Pulmonary neg pulmonary ROS, former smoker,    Pulmonary exam normal breath sounds clear to auscultation       Cardiovascular hypertension, negative cardio ROS Normal cardiovascular exam Rhythm:regular Rate:Normal     Neuro/Psych negative neurological ROS  negative psych ROS   GI/Hepatic negative GI ROS, Neg liver ROS,   Endo/Other  Hypothyroidism   Renal/GU negative Renal ROS  negative genitourinary   Musculoskeletal  (+) Arthritis , Osteoarthritis,    Abdominal   Peds  Hematology negative hematology ROS (+)   Anesthesia Other Findings   Reproductive/Obstetrics (+) Pregnancy                            Anesthesia Physical Anesthesia Plan  ASA: II  Anesthesia Plan: Spinal   Post-op Pain Management:    Induction:   PONV Risk Score and Plan: 2 and Ondansetron, Treatment may vary due to age or medical condition and Dexamethasone  Airway Management Planned: Nasal Cannula and Natural Airway  Additional Equipment:   Intra-op Plan:   Post-operative Plan:   Informed Consent: I have reviewed the patients History and Physical, chart, labs and discussed the procedure including the risks, benefits and alternatives for the proposed anesthesia with the patient or authorized representative who has indicated his/her understanding and acceptance.       Plan Discussed with: CRNA, Anesthesiologist and Surgeon  Anesthesia Plan Comments: (See PAT note 08/17/18, Konrad Felix, PA-C)       Anesthesia Quick Evaluation

## 2018-08-21 ENCOUNTER — Observation Stay (HOSPITAL_COMMUNITY)
Admission: RE | Admit: 2018-08-21 | Discharge: 2018-08-22 | Disposition: A | Discharge status: Home / Self Care | Payer: 59 | Attending: Orthopaedic Surgery | Admitting: Orthopaedic Surgery

## 2018-08-21 ENCOUNTER — Ambulatory Visit (HOSPITAL_COMMUNITY): Payer: 59 | Admitting: Physician Assistant

## 2018-08-21 ENCOUNTER — Encounter (HOSPITAL_COMMUNITY): Payer: Self-pay | Admitting: *Deleted

## 2018-08-21 ENCOUNTER — Encounter (HOSPITAL_COMMUNITY)
Admission: RE | Disposition: A | Discharge status: Home / Self Care | Payer: Self-pay | Source: Home / Self Care | Attending: Orthopaedic Surgery

## 2018-08-21 ENCOUNTER — Ambulatory Visit (HOSPITAL_COMMUNITY): Payer: 59

## 2018-08-21 ENCOUNTER — Other Ambulatory Visit: Payer: Self-pay | Admitting: Orthopaedic Surgery

## 2018-08-21 ENCOUNTER — Other Ambulatory Visit: Payer: Self-pay

## 2018-08-21 ENCOUNTER — Ambulatory Visit (HOSPITAL_COMMUNITY): Payer: 59 | Admitting: Anesthesiology

## 2018-08-21 DIAGNOSIS — E785 Hyperlipidemia, unspecified: Secondary | ICD-10-CM | POA: Insufficient documentation

## 2018-08-21 DIAGNOSIS — Z79899 Other long term (current) drug therapy: Secondary | ICD-10-CM | POA: Insufficient documentation

## 2018-08-21 DIAGNOSIS — Z419 Encounter for procedure for purposes other than remedying health state, unspecified: Secondary | ICD-10-CM

## 2018-08-21 DIAGNOSIS — I10 Essential (primary) hypertension: Secondary | ICD-10-CM | POA: Diagnosis not present

## 2018-08-21 DIAGNOSIS — Z885 Allergy status to narcotic agent status: Secondary | ICD-10-CM | POA: Diagnosis not present

## 2018-08-21 DIAGNOSIS — Z79890 Hormone replacement therapy (postmenopausal): Secondary | ICD-10-CM | POA: Diagnosis not present

## 2018-08-21 DIAGNOSIS — M1611 Unilateral primary osteoarthritis, right hip: Secondary | ICD-10-CM | POA: Diagnosis not present

## 2018-08-21 DIAGNOSIS — Z882 Allergy status to sulfonamides status: Secondary | ICD-10-CM | POA: Diagnosis not present

## 2018-08-21 DIAGNOSIS — E039 Hypothyroidism, unspecified: Secondary | ICD-10-CM | POA: Diagnosis not present

## 2018-08-21 DIAGNOSIS — Z87891 Personal history of nicotine dependence: Secondary | ICD-10-CM | POA: Insufficient documentation

## 2018-08-21 HISTORY — PX: TOTAL HIP ARTHROPLASTY: SHX124

## 2018-08-21 LAB — TYPE AND SCREEN
ABO/RH(D): A POS
Antibody Screen: NEGATIVE

## 2018-08-21 LAB — BASIC METABOLIC PANEL
Anion gap: 8 (ref 5–15)
BUN: 27 mg/dL — ABNORMAL HIGH (ref 8–23)
CO2: 26 mmol/L (ref 22–32)
Calcium: 8.9 mg/dL (ref 8.9–10.3)
Chloride: 104 mmol/L (ref 98–111)
Creatinine, Ser: 0.81 mg/dL (ref 0.44–1.00)
GFR calc Af Amer: >60 mL/min (ref >60)
GFR calc non Af Amer: >60 mL/min (ref >60)
Glucose, Bld: 134 mg/dL — ABNORMAL HIGH (ref 70–99)
Potassium: 3.8 mmol/L (ref 3.5–5.1)
Sodium: 138 mmol/L (ref 135–145)

## 2018-08-21 LAB — CBC WITH DIFFERENTIAL/PLATELET
Abs Immature Granulocytes: 0.1 10*3/uL — ABNORMAL HIGH (ref 0.00–0.07)
Basophils Absolute: 0 10*3/uL (ref 0.0–0.1)
Basophils Relative: 0 %
Eosinophils Absolute: 0.1 10*3/uL (ref 0.0–0.5)
Eosinophils Relative: 1 %
HCT: 37.9 % (ref 36.0–46.0)
Hemoglobin: 12.6 g/dL (ref 12.0–15.0)
Immature Granulocytes: 1 %
Lymphocytes Relative: 5 %
Lymphs Abs: 0.6 10*3/uL — ABNORMAL LOW (ref 0.7–4.0)
MCH: 31.4 pg (ref 26.0–34.0)
MCHC: 33.2 g/dL (ref 30.0–36.0)
MCV: 94.5 fL (ref 80.0–100.0)
Monocytes Absolute: 0.3 10*3/uL (ref 0.1–1.0)
Monocytes Relative: 3 %
Neutro Abs: 10.3 10*3/uL — ABNORMAL HIGH (ref 1.7–7.7)
Neutrophils Relative %: 90 %
Platelets: 242 10*3/uL (ref 150–400)
RBC: 4.01 MIL/uL (ref 3.87–5.11)
RDW: 12.1 % (ref 11.5–15.5)
WBC: 11.4 10*3/uL — ABNORMAL HIGH (ref 4.0–10.5)
nRBC: 0 % (ref 0.0–0.2)

## 2018-08-21 LAB — APTT: aPTT: 27 seconds (ref 24–36)

## 2018-08-21 LAB — PROTIME-INR
INR: 1.1 (ref 0.8–1.2)
Prothrombin Time: 13.8 seconds (ref 11.4–15.2)

## 2018-08-21 SURGERY — ARTHROPLASTY, HIP, TOTAL, ANTERIOR APPROACH
Anesthesia: Spinal | Laterality: Right

## 2018-08-21 MED ORDER — PROPOFOL 10 MG/ML IV BOLUS
INTRAVENOUS | Status: AC
  Filled 2018-08-21: qty 80

## 2018-08-21 MED ORDER — SODIUM CHLORIDE 0.9 % IV SOLN
INTRAVENOUS | Status: DC | PRN
  Administered 2018-08-21: 40 ug/min via INTRAVENOUS

## 2018-08-21 MED ORDER — CEFAZOLIN SODIUM-DEXTROSE 2-4 GM/100ML-% IV SOLN
INTRAVENOUS | Status: AC
  Filled 2018-08-21: qty 100

## 2018-08-21 MED ORDER — TRANEXAMIC ACID 1000 MG/10ML IV SOLN: 2000.0000 mg | INTRAVENOUS | Status: DC

## 2018-08-21 MED ORDER — METHOCARBAMOL 500 MG IVPB - SIMPLE MED
500.0000 mg | Freq: Four times a day (QID) | INTRAVENOUS | Status: DC | PRN
  Filled 2018-08-21: qty 50

## 2018-08-21 MED ORDER — ONDANSETRON HCL 4 MG PO TABS: 4.0000 mg | ORAL_TABLET | Freq: Four times a day (QID) | ORAL | Status: DC | PRN

## 2018-08-21 MED ORDER — DOCUSATE SODIUM 100 MG PO CAPS
100.0000 mg | ORAL_CAPSULE | Freq: Two times a day (BID) | ORAL | Status: DC
  Administered 2018-08-21 – 2018-08-22 (×2): 100 mg via ORAL
  Filled 2018-08-21 (×2): qty 1

## 2018-08-21 MED ORDER — OXYCODONE HCL 5 MG/5ML PO SOLN: 5.0000 mg | Freq: Once | ORAL | Status: DC | PRN

## 2018-08-21 MED ORDER — SIMVASTATIN 20 MG PO TABS
20.0000 mg | ORAL_TABLET | Freq: Every day | ORAL | Status: DC
  Administered 2018-08-21: 20 mg via ORAL
  Filled 2018-08-21: qty 1

## 2018-08-21 MED ORDER — 0.9 % SODIUM CHLORIDE (POUR BTL) OPTIME
TOPICAL | Status: DC | PRN
  Administered 2018-08-21: 11:00:00 1000 mL

## 2018-08-21 MED ORDER — MIDAZOLAM HCL 2 MG/2ML IJ SOLN
INTRAMUSCULAR | Status: DC | PRN
  Administered 2018-08-21: 2 mg via INTRAVENOUS

## 2018-08-21 MED ORDER — TRANEXAMIC ACID-NACL 1000-0.7 MG/100ML-% IV SOLN: 2000.0000 mg | Freq: Once | INTRAVENOUS | Status: DC

## 2018-08-21 MED ORDER — METOCLOPRAMIDE HCL 5 MG/ML IJ SOLN: 5.0000 mg | Freq: Three times a day (TID) | INTRAMUSCULAR | Status: DC | PRN

## 2018-08-21 MED ORDER — CEFAZOLIN SODIUM-DEXTROSE 2-4 GM/100ML-% IV SOLN: 2.0000 g | INTRAVENOUS | Status: DC

## 2018-08-21 MED ORDER — ACETAMINOPHEN 500 MG PO TABS
1000.0000 mg | ORAL_TABLET | Freq: Four times a day (QID) | ORAL | Status: DC
  Administered 2018-08-21 – 2018-08-22 (×3): 1000 mg via ORAL
  Filled 2018-08-21 (×3): qty 2

## 2018-08-21 MED ORDER — CEFAZOLIN SODIUM-DEXTROSE 2-4 GM/100ML-% IV SOLN
2.0000 g | Freq: Four times a day (QID) | INTRAVENOUS | Status: AC
  Administered 2018-08-21 (×2): 2 g via INTRAVENOUS
  Filled 2018-08-21 (×2): qty 100

## 2018-08-21 MED ORDER — LACTATED RINGERS IV SOLN: INTRAVENOUS | Status: DC

## 2018-08-21 MED ORDER — LORATADINE 10 MG PO TABS: 10.0000 mg | ORAL_TABLET | Freq: Every day | ORAL | Status: DC

## 2018-08-21 MED ORDER — CHLORHEXIDINE GLUCONATE 4 % EX LIQD: 60.0000 mL | Freq: Once | CUTANEOUS | Status: DC

## 2018-08-21 MED ORDER — ASPIRIN EC 325 MG PO TBEC
325.0000 mg | DELAYED_RELEASE_TABLET | Freq: Two times a day (BID) | ORAL | Status: DC
  Administered 2018-08-22: 325 mg via ORAL
  Filled 2018-08-21: qty 1

## 2018-08-21 MED ORDER — LOSARTAN POTASSIUM 50 MG PO TABS
100.0000 mg | ORAL_TABLET | Freq: Every day | ORAL | Status: DC
  Administered 2018-08-22: 100 mg via ORAL
  Filled 2018-08-21: qty 2

## 2018-08-21 MED ORDER — LACTATED RINGERS IV SOLN
INTRAVENOUS | Status: DC
  Administered 2018-08-21: 13:00:00 via INTRAVENOUS

## 2018-08-21 MED ORDER — PHENYLEPHRINE HCL (PRESSORS) 10 MG/ML IV SOLN
INTRAVENOUS | Status: AC
  Filled 2018-08-21: qty 2

## 2018-08-21 MED ORDER — MEPERIDINE HCL 50 MG/ML IJ SOLN: 6.2500 mg | INTRAMUSCULAR | Status: DC | PRN

## 2018-08-21 MED ORDER — BUPIVACAINE LIPOSOME 1.3 % IJ SUSP: 10.0000 mL | Freq: Once | INTRAMUSCULAR | Status: DC

## 2018-08-21 MED ORDER — PROPOFOL 10 MG/ML IV BOLUS
INTRAVENOUS | Status: DC | PRN
  Administered 2018-08-21 (×2): 20 mg via INTRAVENOUS
  Administered 2018-08-21 (×2): 30 mg via INTRAVENOUS

## 2018-08-21 MED ORDER — METHOCARBAMOL 500 MG PO TABS
500.0000 mg | ORAL_TABLET | Freq: Four times a day (QID) | ORAL | Status: DC | PRN
  Administered 2018-08-21 – 2018-08-22 (×2): 500 mg via ORAL
  Filled 2018-08-21 (×2): qty 1

## 2018-08-21 MED ORDER — MONTELUKAST SODIUM 10 MG PO TABS
10.0000 mg | ORAL_TABLET | Freq: Every day | ORAL | Status: DC
  Administered 2018-08-21: 10 mg via ORAL
  Filled 2018-08-21: qty 1

## 2018-08-21 MED ORDER — ONDANSETRON HCL 4 MG/2ML IJ SOLN
INTRAMUSCULAR | Status: DC | PRN
  Administered 2018-08-21: 4 mg via INTRAVENOUS

## 2018-08-21 MED ORDER — PROPOFOL 10 MG/ML IV BOLUS
INTRAVENOUS | Status: AC
  Filled 2018-08-21: qty 20

## 2018-08-21 MED ORDER — BUPIVACAINE-EPINEPHRINE (PF) 0.25% -1:200000 IJ SOLN
INTRAMUSCULAR | Status: DC | PRN
  Administered 2018-08-21: 30 mL

## 2018-08-21 MED ORDER — OXYCODONE HCL 5 MG PO TABS: 5.0000 mg | ORAL_TABLET | Freq: Once | ORAL | Status: DC | PRN

## 2018-08-21 MED ORDER — LACTATED RINGERS IV SOLN
INTRAVENOUS | Status: DC
  Administered 2018-08-21: 09:00:00 via INTRAVENOUS

## 2018-08-21 MED ORDER — BUPIVACAINE LIPOSOME 1.3 % IJ SUSP
10.0000 mL | Freq: Once | INTRAMUSCULAR | Status: DC
  Filled 2018-08-21: qty 10

## 2018-08-21 MED ORDER — POVIDONE-IODINE 10 % EX SWAB: 2.0000 "application " | Freq: Once | CUTANEOUS | Status: DC

## 2018-08-21 MED ORDER — TRANEXAMIC ACID-NACL 1000-0.7 MG/100ML-% IV SOLN: 1000.0000 mg | INTRAVENOUS | Status: DC

## 2018-08-21 MED ORDER — ACETAMINOPHEN 325 MG PO TABS: 325.0000 mg | ORAL_TABLET | Freq: Four times a day (QID) | ORAL | Status: DC | PRN

## 2018-08-21 MED ORDER — ONDANSETRON HCL 4 MG/2ML IJ SOLN: 4.0000 mg | Freq: Once | INTRAMUSCULAR | Status: DC | PRN

## 2018-08-21 MED ORDER — TRANEXAMIC ACID 1000 MG/10ML IV SOLN: 2000.0000 mg | Freq: Once | INTRAVENOUS | Status: DC

## 2018-08-21 MED ORDER — DEXAMETHASONE SODIUM PHOSPHATE 10 MG/ML IJ SOLN
INTRAMUSCULAR | Status: DC | PRN
  Administered 2018-08-21: 8 mg via INTRAVENOUS

## 2018-08-21 MED ORDER — KETOROLAC TROMETHAMINE 15 MG/ML IJ SOLN
15.0000 mg | Freq: Four times a day (QID) | INTRAMUSCULAR | Status: DC
  Administered 2018-08-21 – 2018-08-22 (×3): 15 mg via INTRAVENOUS
  Filled 2018-08-21 (×3): qty 1

## 2018-08-21 MED ORDER — TRANEXAMIC ACID 1000 MG/10ML IV SOLN
INTRAVENOUS | Status: DC | PRN
  Administered 2018-08-21: 11:00:00 2000 mg via TOPICAL

## 2018-08-21 MED ORDER — PHENOL 1.4 % MT LIQD: 1.0000 | OROMUCOSAL | Status: DC | PRN

## 2018-08-21 MED ORDER — BUPIVACAINE-EPINEPHRINE (PF) 0.25% -1:200000 IJ SOLN
INTRAMUSCULAR | Status: AC
  Filled 2018-08-21: qty 30

## 2018-08-21 MED ORDER — PROPOFOL 500 MG/50ML IV EMUL
INTRAVENOUS | Status: DC | PRN
  Administered 2018-08-21: 125 ug/kg/min via INTRAVENOUS

## 2018-08-21 MED ORDER — BUPIVACAINE LIPOSOME 1.3 % IJ SUSP
INTRAMUSCULAR | Status: DC | PRN
  Administered 2018-08-21: 10 mL

## 2018-08-21 MED ORDER — ACETAMINOPHEN 325 MG PO TABS: 325.0000 mg | ORAL_TABLET | ORAL | Status: DC | PRN

## 2018-08-21 MED ORDER — PHENYLEPHRINE 40 MCG/ML (10ML) SYRINGE FOR IV PUSH (FOR BLOOD PRESSURE SUPPORT)
PREFILLED_SYRINGE | INTRAVENOUS | Status: AC
  Filled 2018-08-21: qty 10

## 2018-08-21 MED ORDER — ALUM & MAG HYDROXIDE-SIMETH 200-200-20 MG/5ML PO SUSP: 30.0000 mL | ORAL | Status: DC | PRN

## 2018-08-21 MED ORDER — ONDANSETRON HCL 4 MG/2ML IJ SOLN
INTRAMUSCULAR | Status: AC
  Filled 2018-08-21: qty 2

## 2018-08-21 MED ORDER — METOPROLOL SUCCINATE ER 50 MG PO TB24
100.0000 mg | ORAL_TABLET | Freq: Every day | ORAL | Status: DC
  Administered 2018-08-22: 100 mg via ORAL
  Filled 2018-08-21: qty 2

## 2018-08-21 MED ORDER — LEVOTHYROXINE SODIUM 25 MCG PO TABS
25.0000 ug | ORAL_TABLET | Freq: Every day | ORAL | Status: DC
  Administered 2018-08-22: 25 ug via ORAL
  Filled 2018-08-21: qty 1

## 2018-08-21 MED ORDER — MENTHOL 3 MG MT LOZG: 1.0000 | LOZENGE | OROMUCOSAL | Status: DC | PRN

## 2018-08-21 MED ORDER — MIDAZOLAM HCL 2 MG/2ML IJ SOLN
INTRAMUSCULAR | Status: AC
  Filled 2018-08-21: qty 2

## 2018-08-21 MED ORDER — OXYCODONE HCL 5 MG PO TABS
5.0000 mg | ORAL_TABLET | ORAL | Status: DC | PRN
  Administered 2018-08-21: 10 mg via ORAL
  Administered 2018-08-21 (×2): 5 mg via ORAL
  Administered 2018-08-22: 10 mg via ORAL
  Administered 2018-08-22: 5 mg via ORAL
  Filled 2018-08-21 (×2): qty 1
  Filled 2018-08-21: qty 2
  Filled 2018-08-21: qty 1
  Filled 2018-08-21: qty 2

## 2018-08-21 MED ORDER — DEXAMETHASONE SODIUM PHOSPHATE 10 MG/ML IJ SOLN
INTRAMUSCULAR | Status: AC
  Filled 2018-08-21: qty 1

## 2018-08-21 MED ORDER — FENTANYL CITRATE (PF) 100 MCG/2ML IJ SOLN: 25.0000 ug | INTRAMUSCULAR | Status: DC | PRN

## 2018-08-21 MED ORDER — DIPHENHYDRAMINE HCL 12.5 MG/5ML PO ELIX: 12.5000 mg | ORAL_SOLUTION | ORAL | Status: DC | PRN

## 2018-08-21 MED ORDER — BUPIVACAINE IN DEXTROSE 0.75-8.25 % IT SOLN
INTRATHECAL | Status: DC | PRN
  Administered 2018-08-21: 1.8 mL via INTRATHECAL

## 2018-08-21 MED ORDER — ONDANSETRON HCL 4 MG/2ML IJ SOLN: 4.0000 mg | Freq: Four times a day (QID) | INTRAMUSCULAR | Status: DC | PRN

## 2018-08-21 MED ORDER — TRANEXAMIC ACID-NACL 1000-0.7 MG/100ML-% IV SOLN
1000.0000 mg | Freq: Once | INTRAVENOUS | Status: AC
  Administered 2018-08-21: 1000 mg via INTRAVENOUS
  Filled 2018-08-21: qty 100

## 2018-08-21 MED ORDER — PHENYLEPHRINE 40 MCG/ML (10ML) SYRINGE FOR IV PUSH (FOR BLOOD PRESSURE SUPPORT)
PREFILLED_SYRINGE | INTRAVENOUS | Status: DC | PRN
  Administered 2018-08-21 (×4): 120 ug via INTRAVENOUS

## 2018-08-21 MED ORDER — METOCLOPRAMIDE HCL 5 MG PO TABS: 5.0000 mg | ORAL_TABLET | Freq: Three times a day (TID) | ORAL | Status: DC | PRN

## 2018-08-21 MED ORDER — HYDROMORPHONE HCL 1 MG/ML IJ SOLN
0.5000 mg | INTRAMUSCULAR | Status: DC | PRN
  Administered 2018-08-21: 20:00:00 1 mg via INTRAVENOUS
  Filled 2018-08-21: qty 1

## 2018-08-21 MED ORDER — ACETAMINOPHEN 160 MG/5ML PO SOLN: 325.0000 mg | ORAL | Status: DC | PRN

## 2018-08-21 MED ORDER — BISACODYL 5 MG PO TBEC: 5.0000 mg | DELAYED_RELEASE_TABLET | Freq: Every day | ORAL | Status: DC | PRN

## 2018-08-21 MED ORDER — TRANEXAMIC ACID 1000 MG/10ML IV SOLN
2000.0000 mg | Freq: Once | INTRAVENOUS | Status: DC
  Filled 2018-08-21: qty 20

## 2018-08-21 MED ORDER — CEFAZOLIN SODIUM-DEXTROSE 2-4 GM/100ML-% IV SOLN
2.0000 g | Freq: Once | INTRAVENOUS | Status: AC
  Administered 2018-08-21: 2 g via INTRAVENOUS

## 2018-08-21 MED ORDER — POLYETHYL GLYCOL-PROPYL GLYCOL 0.4-0.3 % OP GEL: Freq: Every day | OPHTHALMIC | Status: DC | PRN

## 2018-08-21 MED ORDER — AMLODIPINE BESYLATE 5 MG PO TABS
5.0000 mg | ORAL_TABLET | Freq: Every day | ORAL | Status: DC
  Administered 2018-08-22: 5 mg via ORAL
  Filled 2018-08-21: qty 1

## 2018-08-21 SURGICAL SUPPLY — 45 items
BAG DECANTER FOR FLEXI CONT (MISCELLANEOUS) ×2 IMPLANT
BLADE SAW SGTL 18X1.27X75 (BLADE) ×2 IMPLANT
BLADE SURG SZ10 CARB STEEL (BLADE) ×4 IMPLANT
CELLS DAT CNTRL 66122 CELL SVR (MISCELLANEOUS) ×1 IMPLANT
CHLORAPREP W/TINT 26 (MISCELLANEOUS) ×2 IMPLANT
COVER PERINEAL POST (MISCELLANEOUS) ×2 IMPLANT
COVER SURGICAL LIGHT HANDLE (MISCELLANEOUS) ×2 IMPLANT
COVER WAND RF STERILE (DRAPES) IMPLANT
DECANTER SPIKE VIAL GLASS SM (MISCELLANEOUS) ×2 IMPLANT
DRAPE IMP U-DRAPE 54X76 (DRAPES) ×2 IMPLANT
DRAPE STERI IOBAN 125X83 (DRAPES) ×2 IMPLANT
DRAPE U-SHAPE 47X51 STRL (DRAPES) ×4 IMPLANT
DRESSING AQUACEL AG SP 3.5X10 (GAUZE/BANDAGES/DRESSINGS) ×1 IMPLANT
DRSG AQUACEL AG ADV 3.5X10 (GAUZE/BANDAGES/DRESSINGS) ×2 IMPLANT
DRSG AQUACEL AG SP 3.5X10 (GAUZE/BANDAGES/DRESSINGS) ×2
ELECT BLADE TIP CTD 4 INCH (ELECTRODE) ×2 IMPLANT
ELECT REM PT RETURN 15FT ADLT (MISCELLANEOUS) ×2 IMPLANT
ELIMINATOR HOLE APEX DEPUY (Hips) ×2 IMPLANT
GLOVE BIO SURGEON STRL SZ8 (GLOVE) ×4 IMPLANT
GLOVE BIOGEL PI IND STRL 8 (GLOVE) ×2 IMPLANT
GLOVE BIOGEL PI INDICATOR 8 (GLOVE) ×2
GOWN STRL REIN XL XLG (GOWN DISPOSABLE) ×4 IMPLANT
HEAD FEMORAL 32 CERAMIC (Hips) ×2 IMPLANT
HOLDER FOLEY CATH W/STRAP (MISCELLANEOUS) ×2 IMPLANT
KIT TURNOVER KIT A (KITS) IMPLANT
LINER ACETABULAR 32X50 (Liner) ×2 IMPLANT
LINER PINN ACET GRIP 50X100 ×2 IMPLANT
MANIFOLD NEPTUNE II (INSTRUMENTS) ×2 IMPLANT
NEEDLE HYPO 22GX1.5 SAFETY (NEEDLE) ×2 IMPLANT
NS IRRIG 1000ML POUR BTL (IV SOLUTION) ×2 IMPLANT
PACK ANTERIOR HIP CUSTOM (KITS) ×2 IMPLANT
PROTECTOR NERVE ULNAR (MISCELLANEOUS) ×2 IMPLANT
RTRCTR WOUND ALEXIS 18CM MED (MISCELLANEOUS) ×2
STEM FEMORAL SZ 5MM STD ACTIS (Stem) ×2 IMPLANT
SUT ETHIBOND NAB CT1 #1 30IN (SUTURE) ×4 IMPLANT
SUT VIC AB 1 CT1 36 (SUTURE) ×2 IMPLANT
SUT VIC AB 2-0 CT1 27 (SUTURE) ×1
SUT VIC AB 2-0 CT1 TAPERPNT 27 (SUTURE) ×1 IMPLANT
SUT VIC AB 3-0 PS2 18 (SUTURE) ×1
SUT VIC AB 3-0 PS2 18XBRD (SUTURE) ×1 IMPLANT
SUT VLOC 180 0 24IN GS25 (SUTURE) ×2 IMPLANT
SYR 50ML LL SCALE MARK (SYRINGE) ×2 IMPLANT
TRAY FOLEY MTR SLVR 14FR STAT (SET/KITS/TRAYS/PACK) ×2 IMPLANT
TRAY FOLEY MTR SLVR 16FR STAT (SET/KITS/TRAYS/PACK) IMPLANT
YANKAUER SUCT BULB TIP 10FT TU (MISCELLANEOUS) ×2 IMPLANT

## 2018-08-21 NOTE — Evaluation (Signed)
Physical Therapy Evaluation Patient Details Name: Holly Beard MRN: [id number] DOB: 1956/01/17 Today's Date: 08/21/2018   History of Present Illness  63 yo female s/p R DA-THA on 08/21/18. PMH includes OA, diverticulitis, Graves Disease, HLD, HTN, R TKR.   Clinical Impression  Pt presents with moderate R hip pain, difficulty performing bed mobility, post-operative RLE weakness, and decreased activity tolerance post-surgically. Pt to benefit from acute PT to address deficits. Pt ambulated hallway distance with RW with min guard assist, verbal cuing for form and safety provided throughout. Pt educated on ankle pumps (20/hour) to perform this afternoon/evening to increase circulation, to pt's tolerance and limited by pain. PT to progress mobility as tolerated, and will continue to follow acutely.        Follow Up Recommendations Follow surgeon's recommendation for DC plan and follow-up therapies;Supervision for mobility/OOB(HHPT.)    Equipment Recommendations  Rolling walker with 5" wheels;3in1 (PT)    Recommendations for Other Services       Precautions / Restrictions Precautions Precautions: Fall Restrictions Weight Bearing Restrictions: No Other Position/Activity Restrictions: WBAT       Mobility  Bed Mobility Overal bed mobility: Needs Assistance Bed Mobility: Supine to Sit     Supine to sit: Min assist;HOB elevated     General bed mobility comments: Min assist for RLE lifting and translation to EOB. Pt with increased time and use of bedrails to perform.   Transfers Overall transfer level: Needs assistance Equipment used: Rolling walker (2 wheeled) Transfers: Sit to/from Stand Sit to Stand: Min guard;From elevated surface         General transfer comment: Min guard for safety, verbal cuing for hand placement with rising. Pt with use of both hands pushing from bed.   Ambulation/Gait Ambulation/Gait assistance: Min guard;+2 safety/equipment Gait Distance  (Feet): 100 Feet Assistive device: Rolling walker (2 wheeled) Gait Pattern/deviations: Step-to pattern;Step-through pattern;Decreased stride length;Antalgic Gait velocity: decr    General Gait Details: Min guard for safety. Verbal cuing for placement in RW, sequencing although pt quickly progressed to step-through gait, and pushing as opposed to lifting RW.   Stairs            Wheelchair Mobility    Modified Rankin (Stroke Patients Only)       Balance Overall balance assessment: Mild deficits observed, not formally tested                                           Pertinent Vitals/Pain Pain Assessment: 0-10 Pain Score: 5  Pain Location: R hip  Pain Descriptors / Indicators: Sore Pain Intervention(s): Limited activity within patient's tolerance;Monitored during session;Repositioned;Premedicated before session;Ice applied    Home Living Family/patient expects to be discharged to:: Private residence Living Arrangements: Alone Available Help at Discharge: Friend(s)(Pt will have friend stay with her for the rest of the week upon d/c home. ) Type of Home: House Home Access: Stairs to enter;Ramped entrance Entrance Stairs-Rails: Left Entrance Stairs-Number of Steps: 3 Home Layout: One level Home Equipment: None      Prior Function Level of Independence: Independent         Comments: pt works as a Designer, fashion/clothing, currently working from home. Pt has been doing everything for self, has good friends that can assist her as needed. Pt has 11 cats and 1 dog.      Hand Dominance   Dominant Hand: Right  Extremity/Trunk Assessment   Upper Extremity Assessment Upper Extremity Assessment: Overall WFL for tasks assessed    Lower Extremity Assessment Lower Extremity Assessment: Overall WFL for tasks assessed;RLE deficits/detail RLE Deficits / Details: suspected post-surgical weakness; able to perform ankle pumps, quad set, limited heel slides,  SLR with lift assist  RLE Sensation: WNL;decreased light touch(decreased sensation of gluteal region, able to stand and weightshift without difficulty)    Cervical / Trunk Assessment Cervical / Trunk Assessment: Normal  Communication   Communication: No difficulties  Cognition Arousal/Alertness: Awake/alert Behavior During Therapy: WFL for tasks assessed/performed Overall Cognitive Status: Within Functional Limits for tasks assessed                                        General Comments      Exercises     Assessment/Plan    PT Assessment Patient needs continued PT services  PT Problem List Decreased strength;Decreased mobility;Decreased range of motion;Decreased activity tolerance;Decreased balance;Decreased knowledge of use of DME;Pain       PT Treatment Interventions DME instruction;Functional mobility training;Balance training;Gait training;Stair training;Therapeutic exercise;Therapeutic activities;Patient/family education    PT Goals (Current goals can be found in the Care Plan section)  Acute Rehab PT Goals Patient Stated Goal: return home PT Goal Formulation: With patient Time For Goal Achievement: 09/04/18 Potential to Achieve Goals: Good    Frequency 7X/week   Barriers to discharge        Co-evaluation               AM-PAC PT "6 Clicks" Mobility  Outcome Measure Help needed turning from your back to your side while in a flat bed without using bedrails?: A Little Help needed moving from lying on your back to sitting on the side of a flat bed without using bedrails?: A Little Help needed moving to and from a bed to a chair (including a wheelchair)?: A Little Help needed standing up from a chair using your arms (e.g., wheelchair or bedside chair)?: A Little Help needed to walk in hospital room?: A Little Help needed climbing 3-5 steps with a railing? : A Lot 6 Click Score: 17    End of Session Equipment Utilized During Treatment: Gait  belt;Oxygen(O2 reapplied after session ) Activity Tolerance: Patient tolerated treatment well Patient left: in chair;with chair alarm set;with call bell/phone within reach;with SCD's reapplied Nurse Communication: Mobility status PT Visit Diagnosis: Other abnormalities of gait and mobility (R26.89);Muscle weakness (generalized) (M62.81)    Time: 7121-9758 PT Time Calculation (min) (ACUTE ONLY): 21 min   Charges:   PT Evaluation $PT Eval Low Complexity: 1 Low         Barrington Worley Conception Chancy, PT Acute Rehabilitation Services Pager [phone]  Office [phone]  Brooklen Runquist D Elonda Husky 08/21/2018, 5:11 PM

## 2018-08-21 NOTE — Anesthesia Procedure Notes (Signed)
Spinal  Patient location during procedure: OR Start time: 08/21/2018 10:17 AM End time: 08/21/2018 10:20 AM Staffing Performed: anesthesiologist  Preanesthetic Checklist Completed: patient identified, surgical consent, pre-op evaluation, IV checked, risks and benefits discussed and monitors and equipment checked Spinal Block Patient position: sitting Prep: DuraPrep Patient monitoring: continuous pulse ox, heart rate and blood pressure Approach: midline Location: L2-3 Injection technique: single-shot Needle Needle type: Pencan  Needle gauge: 24 G Assessment Sensory level: T6 Additional Notes 1st attempt by CRNA unsuccessful, 2nd attempt by MD successful

## 2018-08-21 NOTE — Plan of Care (Signed)
  Problem: Education: Goal: Knowledge of the prescribed therapeutic regimen will improve Outcome: Progressing Goal: Understanding of discharge needs will improve Outcome: Progressing   Problem: Activity: Goal: Ability to avoid complications of mobility impairment will improve Outcome: Progressing   Problem: Clinical Measurements: Goal: Postoperative complications will be avoided or minimized Outcome: Progressing

## 2018-08-21 NOTE — Interval H&P Note (Signed)
History and Physical Interval Note:  08/21/2018 8:16 AM  [name]  has presented today for surgery, with the diagnosis of RIGHT HIP DEGENERATIVE JOINT DISEASE.  The various methods of treatment have been discussed with the patient and family. After consideration of risks, benefits and other options for treatment, the patient has consented to  Procedure(s): TOTAL HIP ARTHROPLASTY ANTERIOR APPROACH (Right) as a surgical intervention.  The patient's history has been reviewed, patient examined, no change in status, stable for surgery.  I have reviewed the patient's chart and labs.  Questions were answered to the patient's satisfaction.     Holly Beard

## 2018-08-21 NOTE — Transfer of Care (Signed)
Immediate Anesthesia Transfer of Care Note  Patient: Holly Beard  Procedure(s) Performed: TOTAL HIP ARTHROPLASTY ANTERIOR APPROACH (Right )  Patient Location: PACU  Anesthesia Type:Spinal  Level of Consciousness: awake, alert  and oriented  Airway & Oxygen Therapy: Patient Spontanous Breathing and Patient connected to face mask oxygen  Post-op Assessment: Report given to RN and Post -op Vital signs reviewed and stable  Post vital signs: Reviewed and stable  Last Vitals:  Vitals Value Taken Time  BP 118/55 08/21/2018 12:07 PM  Temp    Pulse 54 08/21/2018 12:08 PM  Resp 14 08/21/2018 12:08 PM  SpO2 100 % 08/21/2018 12:08 PM  Vitals shown include unvalidated device data.  Last Pain:  Vitals:   08/21/18 0831  TempSrc:   PainSc: 3       Patients Stated Pain Goal: 5 (91/91/66 0600)  Complications: No apparent anesthesia complications

## 2018-08-21 NOTE — Op Note (Signed)

## 2018-08-21 NOTE — Anesthesia Procedure Notes (Signed)
Procedure Name: MAC Date/Time: 08/21/2018 10:15 AM Performed by: Niel Hummer, CRNA Pre-anesthesia Checklist: Patient identified, Emergency Drugs available, Suction available and Patient being monitored Patient Re-evaluated:Patient Re-evaluated prior to induction Oxygen Delivery Method: Simple face mask

## 2018-08-21 NOTE — Anesthesia Postprocedure Evaluation (Signed)
Anesthesia Post Note  Patient: Holly Beard  Procedure(s) Performed: TOTAL HIP ARTHROPLASTY ANTERIOR APPROACH (Right )     Patient location during evaluation: PACU Anesthesia Type: Spinal Level of consciousness: oriented and awake and alert Pain management: pain level controlled Vital Signs Assessment: post-procedure vital signs reviewed and stable Respiratory status: spontaneous breathing, respiratory function stable and patient connected to nasal cannula oxygen Cardiovascular status: blood pressure returned to baseline and stable Postop Assessment: no headache, no backache and no apparent nausea or vomiting Anesthetic complications: no    Last Vitals:  Vitals:   08/21/18 1702 08/21/18 2033  BP: 118/73 118/73  Pulse: 70 76  Resp: 14 18  Temp: 37.1 C 36.7 C  SpO2: 98% 100%    Last Pain:  Vitals:   08/21/18 2033  TempSrc: Oral  PainSc:                  Holly Beard

## 2018-08-22 ENCOUNTER — Encounter (HOSPITAL_COMMUNITY): Payer: Self-pay | Admitting: Orthopaedic Surgery

## 2018-08-22 DIAGNOSIS — M1611 Unilateral primary osteoarthritis, right hip: Secondary | ICD-10-CM | POA: Diagnosis not present

## 2018-08-22 DIAGNOSIS — Z79899 Other long term (current) drug therapy: Secondary | ICD-10-CM | POA: Diagnosis not present

## 2018-08-22 DIAGNOSIS — Z79890 Hormone replacement therapy (postmenopausal): Secondary | ICD-10-CM | POA: Diagnosis not present

## 2018-08-22 DIAGNOSIS — I10 Essential (primary) hypertension: Secondary | ICD-10-CM | POA: Diagnosis not present

## 2018-08-22 DIAGNOSIS — Z885 Allergy status to narcotic agent status: Secondary | ICD-10-CM | POA: Diagnosis not present

## 2018-08-22 DIAGNOSIS — E039 Hypothyroidism, unspecified: Secondary | ICD-10-CM | POA: Diagnosis not present

## 2018-08-22 DIAGNOSIS — Z882 Allergy status to sulfonamides status: Secondary | ICD-10-CM | POA: Diagnosis not present

## 2018-08-22 DIAGNOSIS — E785 Hyperlipidemia, unspecified: Secondary | ICD-10-CM | POA: Diagnosis not present

## 2018-08-22 DIAGNOSIS — Z87891 Personal history of nicotine dependence: Secondary | ICD-10-CM | POA: Diagnosis not present

## 2018-08-22 MED ORDER — TIZANIDINE HCL 4 MG PO TABS: 4.0000 mg | ORAL_TABLET | Freq: Four times a day (QID) | ORAL | 1 refills | Status: DC | PRN

## 2018-08-22 MED ORDER — ASPIRIN 325 MG PO TBEC: 325.0000 mg | DELAYED_RELEASE_TABLET | Freq: Two times a day (BID) | ORAL | 0 refills | Status: DC

## 2018-08-22 NOTE — Progress Notes (Signed)
Subjective: 1 Day Post-Op Procedure(s) (LRB): TOTAL HIP ARTHROPLASTY ANTERIOR APPROACH (Right)   Patient is doing great. No real pain. She is looking forward to going home today.  Activity level:  wbat Diet tolerance:  ok Voiding:  ok Patient reports pain as mild.    Objective: Vital signs in last 24 hours: Temp:  [97.5 F (36.4 C)-98.8 F (37.1 C)] 98 F (36.7 C) (05/13 0443) Pulse Rate:  [51-76] 72 (05/13 0443) Resp:  [13-18] 16 (05/13 0443) BP: (86-127)/(55-80) 120/69 (05/13 0443) SpO2:  [93 %-100 %] 95 % (05/13 0443) Weight:  [83.9 kg] 83.9 kg (05/12 0831)  Labs: Recent Labs    08/21/18 1420  HGB 12.6   Recent Labs    08/21/18 1420  WBC 11.4*  RBC 4.01  HCT 37.9  PLT 242   Recent Labs    08/21/18 1420  NA 138  K 3.8  CL 104  CO2 26  BUN 27*  CREATININE 0.81  GLUCOSE 134*  CALCIUM 8.9   Recent Labs    08/21/18 1420  INR 1.1    Physical Exam:  Neurologically intact ABD soft Neurovascular intact Sensation intact distally Intact pulses distally Dorsiflexion/Plantar flexion intact Incision: dressing C/D/I and no drainage No cellulitis present Compartment soft  Assessment/Plan:  1 Day Post-Op Procedure(s) (LRB): TOTAL HIP ARTHROPLASTY ANTERIOR APPROACH (Right) Advance diet Up with therapy Discharge home with home health today after PT. Continue on ASA 325mg  BID x 4 weeks post op. Follow up in office 2 weeks post op.  Holly Beard 08/22/2018, 8:14 AM

## 2018-08-22 NOTE — Plan of Care (Signed)
  Problem: Education: Goal: Knowledge of the prescribed therapeutic regimen will improve Outcome: Adequate for Discharge Goal: Understanding of discharge needs will improve Outcome: Adequate for Discharge Goal: Individualized Educational Video(s) Outcome: Adequate for Discharge   Problem: Activity: Goal: Ability to avoid complications of mobility impairment will improve Outcome: Adequate for Discharge Goal: Ability to tolerate increased activity will improve Outcome: Adequate for Discharge   Problem: Clinical Measurements: Goal: Postoperative complications will be avoided or minimized Outcome: Adequate for Discharge   Problem: Pain Management: Goal: Pain level will decrease with appropriate interventions Outcome: Adequate for Discharge   Problem: Skin Integrity: Goal: Will show signs of wound healing Outcome: Adequate for Discharge   Problem: Education: Goal: Knowledge of General Education information will improve Description Including pain rating scale, medication(s)/side effects and non-pharmacologic comfort measures Outcome: Adequate for Discharge   Problem: Health Behavior/Discharge Planning: Goal: Ability to manage health-related needs will improve Outcome: Adequate for Discharge   Problem: Clinical Measurements: Goal: Ability to maintain clinical measurements within normal limits will improve Outcome: Adequate for Discharge Goal: Will remain free from infection Outcome: Adequate for Discharge Goal: Diagnostic test results will improve Outcome: Adequate for Discharge Goal: Respiratory complications will improve Outcome: Adequate for Discharge Goal: Cardiovascular complication will be avoided Outcome: Adequate for Discharge   Problem: Activity: Goal: Risk for activity intolerance will decrease Outcome: Adequate for Discharge   Problem: Nutrition: Goal: Adequate nutrition will be maintained Outcome: Adequate for Discharge   Problem: Coping: Goal: Level of  anxiety will decrease Outcome: Adequate for Discharge   Problem: Elimination: Goal: Will not experience complications related to bowel motility Outcome: Adequate for Discharge Goal: Will not experience complications related to urinary retention Outcome: Adequate for Discharge   Problem: Pain Managment: Goal: General experience of comfort will improve Outcome: Adequate for Discharge   Problem: Safety: Goal: Ability to remain free from injury will improve Outcome: Adequate for Discharge   Problem: Skin Integrity: Goal: Risk for impaired skin integrity will decrease Outcome: Adequate for Discharge  Discharge teaching given to patient, discussion and written handout. Questions answered.

## 2018-08-22 NOTE — TOC Progression Note (Signed)
Transition of Care [facility]) - Progression Note    Patient Details  Name: Holly Beard MRN: [id number] Date of Birth: [birth year]  Transition of Care [facility]) CM/SW [place], LCSW Phone Number: 08/22/2018, 10:26 AM  Clinical Narrative:    DME provided. [facility] arranged for PT.      Barriers to Discharge: No Barriers Identified  Expected Discharge Plan and Services           Expected Discharge Date: 08/22/18               DME Arranged: 3-N-1, Walker rolling DME Agency: [employer] Date DME Agency Contacted: 08/22/18 Time DME Agency Contacted: [id number] Representative spoke with at DME Agency: Berton Bon [id number] [facility] Arranged: PT [place]: [place] ([place]) Date [place]: 08/22/18 Time [place]: 0900 Representative spoke with at [place]: Santiago Glad [phone]   Social Determinants of Health (SDOH) Interventions    Readmission Risk Interventions No flowsheet data found.

## 2018-08-22 NOTE — Progress Notes (Signed)
Physical Therapy Treatment Patient Details Name: Holly Beard MRN: [id number] DOB: 1955-07-21 Today's Date: 08/22/2018    History of Present Illness 63 yo female s/p R DA-THA on 08/21/18. PMH includes OA, diverticulitis, Graves Disease, HLD, HTN, R TKR.     PT Comments    POD # 1 am session Pt OOB in recliner.  Assisted with amb a greater distance in hallway, practiced stairs.  Then returned to room to perform some TE's following HEP handout.  Instructed on proper tech, freq as well as use of ICE.   Addressed all mobility questions, discussed appropriate activity, educated on use of ICE.  Pt ready for D/C to home.   Follow Up Recommendations  Follow surgeon's recommendation for DC plan and follow-up therapies;Supervision for mobility/OOB     Equipment Recommendations  Rolling walker with 5" wheels;3in1 (PT)    Recommendations for Other Services       Precautions / Restrictions Precautions Precautions: Fall Restrictions Weight Bearing Restrictions: No Other Position/Activity Restrictions: WBAT     Mobility  Bed Mobility               General bed mobility comments: OOB in recliner   Transfers Overall transfer level: Needs assistance Equipment used: Rolling walker (2 wheeled) Transfers: Sit to/from [employer] Transfers Sit to Stand: Supervision;Min guard Stand pivot transfers: Supervision;Min guard       General transfer comment: one VC safety with turns and hand placement with stand to sit  Ambulation/Gait Ambulation/Gait assistance: Supervision Gait Distance (Feet): 125 Feet Assistive device: Rolling walker (2 wheeled) Gait Pattern/deviations: Step-to pattern;Step-through pattern;Decreased stride length;Antalgic Gait velocity: decreased    General Gait Details: one VC on safety with turns.   Upright posture.  Step through gait.     Stairs Stairs: Yes Stairs assistance: Supervision;Min guard Stair Management: No rails;Backwards;With  walker Number of Stairs: 2 General stair comments: performed twice.  Up backward 25% VC's on proper walker placement and sequencing.     Wheelchair Mobility    Modified Rankin (Stroke Patients Only)       Balance                                            Cognition Arousal/Alertness: Awake/alert Behavior During Therapy: WFL for tasks assessed/performed Overall Cognitive Status: Within Functional Limits for tasks assessed                                        Exercises   Total Hip Replacement TE's 10 reps ankle pumps 10 reps knee presses 10 reps heel slides 10 reps SAQ's 10 reps ABD Followed by ICE     General Comments        Pertinent Vitals/Pain Pain Assessment: 0-10 Pain Score: 2  Pain Location: R hip  Pain Descriptors / Indicators: Sore;Tender Pain Intervention(s): Monitored during session;Repositioned;Premedicated before session;Ice applied    Home Living                      Prior Function            PT Goals (current goals can now be found in the care plan section) Progress towards PT goals: Progressing toward goals    Frequency    7X/week      PT Plan  Current plan remains appropriate    Co-evaluation              AM-PAC PT "6 Clicks" Mobility   Outcome Measure  Help needed turning from your back to your side while in a flat bed without using bedrails?: A Little Help needed moving from lying on your back to sitting on the side of a flat bed without using bedrails?: A Little Help needed moving to and from a bed to a chair (including a wheelchair)?: A Little Help needed standing up from a chair using your arms (e.g., wheelchair or bedside chair)?: A Little Help needed to walk in hospital room?: A Little Help needed climbing 3-5 steps with a railing? : A Little 6 Click Score: 18    End of Session Equipment Utilized During Treatment: Gait belt Activity Tolerance: Patient tolerated  treatment well Patient left: in chair;with chair alarm set;with call bell/phone within reach;with SCD's reapplied Nurse Communication: (pt has met goals to D/C to home today after one session) PT Visit Diagnosis: Other abnormalities of gait and mobility (R26.89);Muscle weakness (generalized) (M62.81)     Time: 7185-5015 PT Time Calculation (min) (ACUTE ONLY): 40 min  Charges:  $Gait Training: 8-22 mins $Therapeutic Exercise: 8-22 mins $Therapeutic Activity: 8-22 mins                     Rica Koyanagi  PTA Acute  Rehabilitation Services Pager      [phone] Office      [phone]

## 2018-08-22 NOTE — Discharge Summary (Signed)
Patient ID: Holly Beard MRN: [id number] DOB/AGE: 63-Mar-1957 63 y.o.  Admit date: 08/21/2018 Discharge date: 08/22/2018  Admission Diagnoses:  Principal Problem:   Primary localized osteoarthritis of right hip Active Problems:   Primary osteoarthritis of right hip   Discharge Diagnoses:  Same  Past Medical History:  Diagnosis Date  . Abnormal Pap smear of cervix    yrs ago  . Arthritis   . Diverticulitis   . Family history of adverse reaction to anesthesia    sister- gets wild   . Graves disease    in remission   . Graves' disease without crisis 1997   remission  . History of rheumatic fever age 48  . Hyperlipidemia   . Hypertension   . Hypothyroidism   . Pneumonia    hx  of years ago     Surgeries: Procedure(s): TOTAL HIP ARTHROPLASTY ANTERIOR APPROACH on 08/21/2018   Consultants:   Discharged Condition: Improved  Hospital Course: Holly Beard is an 63 y.o. female who was admitted 08/21/2018 for operative treatment ofPrimary localized osteoarthritis of right hip. Patient has severe unremitting pain that affects sleep, daily activities, and work/hobbies. After pre-op clearance the patient was taken to the operating room on 08/21/2018 and underwent  Procedure(s): TOTAL HIP ARTHROPLASTY ANTERIOR APPROACH.    Patient was given perioperative antibiotics:  Anti-infectives (From admission, onward)   Start     Dose/Rate Route Frequency Ordered Stop   08/22/18 0600  ceFAZolin (ANCEF) IVPB 2g/100 mL premix  Status:  Discontinued     2 g 200 mL/hr over 30 Minutes Intravenous On call to O.R. 08/21/18 1401 08/21/18 1404   08/21/18 1630  ceFAZolin (ANCEF) IVPB 2g/100 mL premix     2 g 200 mL/hr over 30 Minutes Intravenous Every 6 hours 08/21/18 1400 08/21/18 2105   08/21/18 0810  ceFAZolin (ANCEF) IVPB 2g/100 mL premix     2 g 200 mL/hr over 30 Minutes Intravenous  Once 08/21/18 0808 08/21/18 1050   08/21/18 0808  ceFAZolin (ANCEF) 2-4 GM/100ML-% IVPB    Note to  Pharmacy:  Randa Evens  : cabinet override      08/21/18 0808 08/21/18 1020       Patient was given sequential compression devices, early ambulation, and chemoprophylaxis to prevent DVT.  Patient benefited maximally from hospital stay and there were no complications.    Recent vital signs:  Patient Vitals for the past 24 hrs:  BP Temp Temp src Pulse Resp SpO2 Height Weight  08/22/18 0443 120/69 98 F (36.7 C) Oral 72 16 95 % - -  08/22/18 0054 106/65 98.3 F (36.8 C) Oral 75 16 93 % - -  08/21/18 2033 118/73 98.1 F (36.7 C) Oral 76 18 100 % - -  08/21/18 1702 118/73 98.8 F (37.1 C) Oral 70 14 98 % - -  08/21/18 1541 127/79 98.4 F (36.9 C) Oral 71 16 100 % - -  08/21/18 1452 114/68 97.6 F (36.4 C) Oral 69 - 100 % - -  08/21/18 1351 91/80 97.8 F (36.6 C) Oral 64 14 100 % - -  08/21/18 1300 96/67 97.6 F (36.4 C) - 61 13 100 % - -  08/21/18 1245 99/70 - - 64 15 100 % - -  08/21/18 1230 (!) 86/59 - - 64 17 98 % - -  08/21/18 1215 125/61 - - (!) 51 13 100 % - -  08/21/18 1207 (!) 118/55 (!) 97.5 F (36.4 C) - (!) 54 14 100 % - -  08/21/18 0831 - - - - - - 5\' 3"  (1.6 m) 83.9 kg     Recent laboratory studies:  Recent Labs    08/21/18 1420  WBC 11.4*  HGB 12.6  HCT 37.9  PLT 242  NA 138  K 3.8  CL 104  CO2 26  BUN 27*  CREATININE 0.81  GLUCOSE 134*  INR 1.1  CALCIUM 8.9     Discharge Medications:   Allergies as of 08/22/2018      Reactions   Morphine And Related Itching   Sulfa Antibiotics Rash      Medication List    STOP taking these medications   ibuprofen 200 MG tablet Commonly known as:  ADVIL     TAKE these medications   amLODipine 5 MG tablet Commonly known as:  NORVASC Take 5 mg by mouth daily.   aspirin 325 MG EC tablet Take 1 tablet (325 mg total) by mouth 2 (two) times daily after a meal.   BIOFREEZE EX Apply 1 application topically daily as needed (hip pain).   calcium carbonate 500 MG chewable tablet Commonly known as:   TUMS - dosed in mg elemental calcium Chew 2-4 tablets by mouth at bedtime as needed for indigestion or heartburn.   cetirizine 10 MG tablet Commonly known as:  ZYRTEC Take 10 mg by mouth at bedtime.   cholecalciferol 25 MCG (1000 UT) tablet Commonly known as:  VITAMIN D3 Take 1,000 Units by mouth daily.   levothyroxine 25 MCG tablet Commonly known as:  SYNTHROID Take 25 mcg by mouth daily before breakfast.   losartan 100 MG tablet Commonly known as:  COZAAR Take 100 mg by mouth daily.   metoprolol succinate 100 MG 24 hr tablet Commonly known as:  TOPROL-XL Take 100 mg by mouth daily.   metroNIDAZOLE 0.75 % gel Commonly known as:  METROGEL Apply 1 application topically daily as needed (rosacea).   montelukast 10 MG tablet Commonly known as:  SINGULAIR Take 10 mg by mouth at bedtime.   oxyCODONE-acetaminophen 5-325 MG tablet Commonly known as:  PERCOCET/ROXICET Take 1 tablet by mouth 3 (three) times daily as needed for pain.   simvastatin 20 MG tablet Commonly known as:  ZOCOR Take 20 mg by mouth at bedtime.   SYSTANE OP Place 1 drop into both eyes daily as needed (dry eyes).   tiZANidine 4 MG tablet Commonly known as:  Zanaflex Take 1 tablet (4 mg total) by mouth every 6 (six) hours as needed.            Durable Medical Equipment  (From admission, onward)         Start     Ordered   08/21/18 1401  DME Walker rolling  Once    Question:  Patient needs a walker to treat with the following condition  Answer:  Primary osteoarthritis of right hip   08/21/18 1400   08/21/18 1401  DME 3 n 1  Once     08/21/18 1400   08/21/18 1401  DME Bedside commode  Once    Question:  Patient needs a bedside commode to treat with the following condition  Answer:  Primary osteoarthritis of right hip   08/21/18 1400          Diagnostic Studies: Dg C-arm 1-60 Min-no Report  Result Date: 08/21/2018 Fluoroscopy was utilized by the requesting physician.  No radiographic  interpretation.   Dg Hip Operative Unilat With Pelvis Right  Result Date: 08/21/2018 CLINICAL DATA:  Right  hip replacement EXAM: OPERATIVE RIGHT HIP (WITH PELVIS IF PERFORMED) 2 VIEWS TECHNIQUE: Fluoroscopic spot image(s) were submitted for interpretation post-operatively. FLUOROSCOPY TIME:  24 seconds (4.2 mGy) COMPARISON:  None. FINDINGS: Two spot fluoroscopic images of the right hip and lower pelvis are provided for review Images demonstrate the sequela of right total hip replacement. Alignment appears anatomic given AP projection. There is a minimal amount of expected subcutaneous emphysema about the operative site. No radiopaque foreign body. IMPRESSION: Post right total hip replacement without evidence of complication. Electronically Signed   By: Sandi Mariscal M.D.   On: 08/21/2018 11:55    Disposition: Discharge disposition: 01-Home or Self Care       Discharge Instructions    Call MD / Call 911   Complete by:  As directed    If you experience chest pain or shortness of breath, CALL 911 and be transported to the hospital emergency room.  If you develope a fever above 101 F, pus (white drainage) or increased drainage or redness at the wound, or calf pain, call your surgeon's office.   Constipation Prevention   Complete by:  As directed    Drink plenty of fluids.  Prune juice may be helpful.  You may use a stool softener, such as Colace (over the counter) 100 mg twice a day.  Use MiraLax (over the counter) for constipation as needed.   Diet - low sodium heart healthy   Complete by:  As directed    Discharge instructions   Complete by:  As directed    INSTRUCTIONS AFTER JOINT REPLACEMENT   Remove items at home which could result in a fall. This includes throw rugs or furniture in walking pathways ICE to the affected joint every three hours while awake for 30 minutes at a time, for at least the first 3-5 days, and then as needed for pain and swelling.  Continue to use ice for pain and  swelling. You may notice swelling that will progress down to the foot and ankle.  This is normal after surgery.  Elevate your leg when you are not up walking on it.   Continue to use the breathing machine you got in the hospital (incentive spirometer) which will help keep your temperature down.  It is common for your temperature to cycle up and down following surgery, especially at night when you are not up moving around and exerting yourself.  The breathing machine keeps your lungs expanded and your temperature down.   DIET:  As you were doing prior to hospitalization, we recommend a well-balanced diet.  DRESSING / WOUND CARE / SHOWERING  You may shower 3 days after surgery, but keep the wounds dry during showering.  You may use an occlusive plastic wrap (Press'n Seal for example), NO SOAKING/SUBMERGING IN THE BATHTUB.  If the bandage gets wet, change with a clean dry gauze.  If the incision gets wet, pat the wound dry with a clean towel.  ACTIVITY  Increase activity slowly as tolerated, but follow the weight bearing instructions below.   No driving for 6 weeks or until further direction given by your physician.  You cannot drive while taking narcotics.  No lifting or carrying greater than 10 lbs. until further directed by your surgeon. Avoid periods of inactivity such as sitting longer than an hour when not asleep. This helps prevent blood clots.  You may return to work once you are authorized by your doctor.     WEIGHT BEARING   Weight bearing  as tolerated with assist device (walker, cane, etc) as directed, use it as long as suggested by your surgeon or therapist, typically at least 4-6 weeks.   EXERCISES  Results after joint replacement surgery are often greatly improved when you follow the exercise, range of motion and muscle strengthening exercises prescribed by your doctor. Safety measures are also important to protect the joint from further injury. Any time any of these exercises  cause you to have increased pain or swelling, decrease what you are doing until you are comfortable again and then slowly increase them. If you have problems or questions, call your caregiver or physical therapist for advice.   Rehabilitation is important following a joint replacement. After just a few days of immobilization, the muscles of the leg can become weakened and shrink (atrophy).  These exercises are designed to build up the tone and strength of the thigh and leg muscles and to improve motion. Often times heat used for twenty to thirty minutes before working out will loosen up your tissues and help with improving the range of motion but do not use heat for the first two weeks following surgery (sometimes heat can increase post-operative swelling).   These exercises can be done on a training (exercise) mat, on the floor, on a table or on a bed. Use whatever works the best and is most comfortable for you.    Use music or television while you are exercising so that the exercises are a pleasant break in your day. This will make your life better with the exercises acting as a break in your routine that you can look forward to.   Perform all exercises about fifteen times, three times per day or as directed.  You should exercise both the operative leg and the other leg as well.   Exercises include:   Quad Sets - Tighten up the muscle on the front of the thigh (Quad) and hold for 5-10 seconds.   Straight Leg Raises - With your knee straight (if you were given a brace, keep it on), lift the leg to 60 degrees, hold for 3 seconds, and slowly lower the leg.  Perform this exercise against resistance later as your leg gets stronger.  Leg Slides: Lying on your back, slowly slide your foot toward your buttocks, bending your knee up off the floor (only go as far as is comfortable). Then slowly slide your foot back down until your leg is flat on the floor again.  Angel Wings: Lying on your back spread your legs  to the side as far apart as you can without causing discomfort.  Hamstring Strength:  Lying on your back, push your heel against the floor with your leg straight by tightening up the muscles of your buttocks.  Repeat, but this time bend your knee to a comfortable angle, and push your heel against the floor.  You may put a pillow under the heel to make it more comfortable if necessary.   A rehabilitation program following joint replacement surgery can speed recovery and prevent re-injury in the future due to weakened muscles. Contact your doctor or a physical therapist for more information on knee rehabilitation.    CONSTIPATION  Constipation is defined medically as fewer than three stools per week and severe constipation as less than one stool per week.  Even if you have a regular bowel pattern at home, your normal regimen is likely to be disrupted due to multiple reasons following surgery.  Combination of anesthesia, postoperative narcotics,  change in appetite and fluid intake all can affect your bowels.   YOU MUST use at least one of the following options; they are listed in order of increasing strength to get the job done.  They are all available over the counter, and you may need to use some, POSSIBLY even all of these options:    Drink plenty of fluids (prune juice may be helpful) and high fiber foods Colace 100 mg by mouth twice a day  Senokot for constipation as directed and as needed Dulcolax (bisacodyl), take with full glass of water  Miralax (polyethylene glycol) once or twice a day as needed.  If you have tried all these things and are unable to have a bowel movement in the first 3-4 days after surgery call either your surgeon or your primary doctor.    If you experience loose stools or diarrhea, hold the medications until you stool forms back up.  If your symptoms do not get better within 1 week or if they get worse, check with your doctor.  If you experience "the worst abdominal pain  ever" or develop nausea or vomiting, please contact the office immediately for further recommendations for treatment.   ITCHING:  If you experience itching with your medications, try taking only a single pain pill, or even half a pain pill at a time.  You can also use Benadryl over the counter for itching or also to help with sleep.   TED HOSE STOCKINGS:  Use stockings on both legs until for at least 2 weeks or as directed by physician office. They may be removed at night for sleeping.  MEDICATIONS:  See your medication summary on the "After Visit Summary" that nursing will review with you.  You may have some home medications which will be placed on hold until you complete the course of blood thinner medication.  It is important for you to complete the blood thinner medication as prescribed.  PRECAUTIONS:  If you experience chest pain or shortness of breath - call 911 immediately for transfer to the hospital emergency department.   If you develop a fever greater that 101 F, purulent drainage from wound, increased redness or drainage from wound, foul odor from the wound/dressing, or calf pain - CONTACT YOUR SURGEON.                                                   FOLLOW-UP APPOINTMENTS:  If you do not already have a post-op appointment, please call the office for an appointment to be seen by your surgeon.  Guidelines for how soon to be seen are listed in your "After Visit Summary", but are typically between 1-4 weeks after surgery.  OTHER INSTRUCTIONS:   Knee Replacement:  Do not place pillow under knee, focus on keeping the knee straight while resting. CPM instructions: 0-90 degrees, 2 hours in the morning, 2 hours in the afternoon, and 2 hours in the evening. Place foam block, curve side up under heel at all times except when in CPM or when walking.  DO NOT modify, tear, cut, or change the foam block in any way.  MAKE SURE YOU:  Understand these instructions.  Get help right away if you are  not doing well or get worse.    Thank you for letting us be a part of your medical care team.  It is a privilege we respect greatly.  We hope these instructions will help you stay on track for a fast and full recovery!   Increase activity slowly as tolerated   Complete by:  As directed       Follow-up Information    Melrose Nakayama, MD. Schedule an appointment as soon as possible for a visit in 2 weeks.   Specialty:  Orthopedic Surgery Contact information: [place] [address] [phone]            Signed: Larwance Sachs Jahden Schara 08/22/2018, 8:17 AM

## 2018-08-23 DIAGNOSIS — Z96641 Presence of right artificial hip joint: Secondary | ICD-10-CM | POA: Diagnosis not present

## 2018-08-23 DIAGNOSIS — Z96651 Presence of right artificial knee joint: Secondary | ICD-10-CM | POA: Diagnosis not present

## 2018-08-23 DIAGNOSIS — M1990 Unspecified osteoarthritis, unspecified site: Secondary | ICD-10-CM | POA: Diagnosis not present

## 2018-08-23 DIAGNOSIS — Z7982 Long term (current) use of aspirin: Secondary | ICD-10-CM | POA: Diagnosis not present

## 2018-08-23 DIAGNOSIS — Z87891 Personal history of nicotine dependence: Secondary | ICD-10-CM | POA: Diagnosis not present

## 2018-08-23 DIAGNOSIS — Z471 Aftercare following joint replacement surgery: Secondary | ICD-10-CM | POA: Diagnosis not present

## 2018-08-23 DIAGNOSIS — E785 Hyperlipidemia, unspecified: Secondary | ICD-10-CM | POA: Diagnosis not present

## 2018-08-23 DIAGNOSIS — I10 Essential (primary) hypertension: Secondary | ICD-10-CM | POA: Diagnosis not present

## 2018-08-29 DIAGNOSIS — M1990 Unspecified osteoarthritis, unspecified site: Secondary | ICD-10-CM | POA: Diagnosis not present

## 2018-08-29 DIAGNOSIS — Z96651 Presence of right artificial knee joint: Secondary | ICD-10-CM | POA: Diagnosis not present

## 2018-08-29 DIAGNOSIS — Z96641 Presence of right artificial hip joint: Secondary | ICD-10-CM | POA: Diagnosis not present

## 2018-08-29 DIAGNOSIS — Z7982 Long term (current) use of aspirin: Secondary | ICD-10-CM | POA: Diagnosis not present

## 2018-08-29 DIAGNOSIS — E785 Hyperlipidemia, unspecified: Secondary | ICD-10-CM | POA: Diagnosis not present

## 2018-08-29 DIAGNOSIS — I10 Essential (primary) hypertension: Secondary | ICD-10-CM | POA: Diagnosis not present

## 2018-08-29 DIAGNOSIS — Z471 Aftercare following joint replacement surgery: Secondary | ICD-10-CM | POA: Diagnosis not present

## 2018-08-29 DIAGNOSIS — Z87891 Personal history of nicotine dependence: Secondary | ICD-10-CM | POA: Diagnosis not present

## 2018-08-31 DIAGNOSIS — E785 Hyperlipidemia, unspecified: Secondary | ICD-10-CM | POA: Diagnosis not present

## 2018-08-31 DIAGNOSIS — Z471 Aftercare following joint replacement surgery: Secondary | ICD-10-CM | POA: Diagnosis not present

## 2018-08-31 DIAGNOSIS — Z7982 Long term (current) use of aspirin: Secondary | ICD-10-CM | POA: Diagnosis not present

## 2018-08-31 DIAGNOSIS — Z96651 Presence of right artificial knee joint: Secondary | ICD-10-CM | POA: Diagnosis not present

## 2018-08-31 DIAGNOSIS — Z87891 Personal history of nicotine dependence: Secondary | ICD-10-CM | POA: Diagnosis not present

## 2018-08-31 DIAGNOSIS — Z96641 Presence of right artificial hip joint: Secondary | ICD-10-CM | POA: Diagnosis not present

## 2018-08-31 DIAGNOSIS — M1990 Unspecified osteoarthritis, unspecified site: Secondary | ICD-10-CM | POA: Diagnosis not present

## 2018-08-31 DIAGNOSIS — I10 Essential (primary) hypertension: Secondary | ICD-10-CM | POA: Diagnosis not present

## 2018-09-04 DIAGNOSIS — M1611 Unilateral primary osteoarthritis, right hip: Secondary | ICD-10-CM | POA: Diagnosis not present

## 2018-09-05 DIAGNOSIS — R69 Illness, unspecified: Secondary | ICD-10-CM | POA: Diagnosis not present

## 2018-09-06 DIAGNOSIS — Z96641 Presence of right artificial hip joint: Secondary | ICD-10-CM | POA: Diagnosis not present

## 2018-09-06 DIAGNOSIS — M6281 Muscle weakness (generalized): Secondary | ICD-10-CM | POA: Diagnosis not present

## 2018-09-06 DIAGNOSIS — M25651 Stiffness of right hip, not elsewhere classified: Secondary | ICD-10-CM | POA: Diagnosis not present

## 2018-09-11 DIAGNOSIS — Z96641 Presence of right artificial hip joint: Secondary | ICD-10-CM | POA: Diagnosis not present

## 2018-09-11 DIAGNOSIS — M25651 Stiffness of right hip, not elsewhere classified: Secondary | ICD-10-CM | POA: Diagnosis not present

## 2018-09-11 DIAGNOSIS — M6281 Muscle weakness (generalized): Secondary | ICD-10-CM | POA: Diagnosis not present

## 2018-09-13 DIAGNOSIS — M6281 Muscle weakness (generalized): Secondary | ICD-10-CM | POA: Diagnosis not present

## 2018-09-13 DIAGNOSIS — Z96641 Presence of right artificial hip joint: Secondary | ICD-10-CM | POA: Diagnosis not present

## 2018-09-13 DIAGNOSIS — M25651 Stiffness of right hip, not elsewhere classified: Secondary | ICD-10-CM | POA: Diagnosis not present

## 2018-09-18 DIAGNOSIS — M6281 Muscle weakness (generalized): Secondary | ICD-10-CM | POA: Diagnosis not present

## 2018-09-18 DIAGNOSIS — M25651 Stiffness of right hip, not elsewhere classified: Secondary | ICD-10-CM | POA: Diagnosis not present

## 2018-09-18 DIAGNOSIS — Z96641 Presence of right artificial hip joint: Secondary | ICD-10-CM | POA: Diagnosis not present

## 2018-09-19 DIAGNOSIS — R69 Illness, unspecified: Secondary | ICD-10-CM | POA: Diagnosis not present

## 2018-09-20 DIAGNOSIS — Z96641 Presence of right artificial hip joint: Secondary | ICD-10-CM | POA: Diagnosis not present

## 2018-09-20 DIAGNOSIS — M25651 Stiffness of right hip, not elsewhere classified: Secondary | ICD-10-CM | POA: Diagnosis not present

## 2018-09-20 DIAGNOSIS — M6281 Muscle weakness (generalized): Secondary | ICD-10-CM | POA: Diagnosis not present

## 2018-09-28 DIAGNOSIS — M25651 Stiffness of right hip, not elsewhere classified: Secondary | ICD-10-CM | POA: Diagnosis not present

## 2018-09-28 DIAGNOSIS — M6281 Muscle weakness (generalized): Secondary | ICD-10-CM | POA: Diagnosis not present

## 2018-09-28 DIAGNOSIS — Z96641 Presence of right artificial hip joint: Secondary | ICD-10-CM | POA: Diagnosis not present

## 2018-10-03 DIAGNOSIS — M6281 Muscle weakness (generalized): Secondary | ICD-10-CM | POA: Diagnosis not present

## 2018-10-03 DIAGNOSIS — Z96641 Presence of right artificial hip joint: Secondary | ICD-10-CM | POA: Diagnosis not present

## 2018-10-03 DIAGNOSIS — M25651 Stiffness of right hip, not elsewhere classified: Secondary | ICD-10-CM | POA: Diagnosis not present

## 2018-10-03 DIAGNOSIS — R69 Illness, unspecified: Secondary | ICD-10-CM | POA: Diagnosis not present

## 2018-10-05 DIAGNOSIS — Z96641 Presence of right artificial hip joint: Secondary | ICD-10-CM | POA: Diagnosis not present

## 2018-10-05 DIAGNOSIS — M6281 Muscle weakness (generalized): Secondary | ICD-10-CM | POA: Diagnosis not present

## 2018-10-05 DIAGNOSIS — M25651 Stiffness of right hip, not elsewhere classified: Secondary | ICD-10-CM | POA: Diagnosis not present

## 2018-10-09 DIAGNOSIS — M6281 Muscle weakness (generalized): Secondary | ICD-10-CM | POA: Diagnosis not present

## 2018-10-09 DIAGNOSIS — M25651 Stiffness of right hip, not elsewhere classified: Secondary | ICD-10-CM | POA: Diagnosis not present

## 2018-10-09 DIAGNOSIS — Z96641 Presence of right artificial hip joint: Secondary | ICD-10-CM | POA: Diagnosis not present

## 2018-10-11 DIAGNOSIS — M25651 Stiffness of right hip, not elsewhere classified: Secondary | ICD-10-CM | POA: Diagnosis not present

## 2018-10-11 DIAGNOSIS — M6281 Muscle weakness (generalized): Secondary | ICD-10-CM | POA: Diagnosis not present

## 2018-10-11 DIAGNOSIS — Z96641 Presence of right artificial hip joint: Secondary | ICD-10-CM | POA: Diagnosis not present

## 2018-10-15 DIAGNOSIS — Z96641 Presence of right artificial hip joint: Secondary | ICD-10-CM | POA: Diagnosis not present

## 2018-10-15 DIAGNOSIS — M6281 Muscle weakness (generalized): Secondary | ICD-10-CM | POA: Diagnosis not present

## 2018-10-15 DIAGNOSIS — M25651 Stiffness of right hip, not elsewhere classified: Secondary | ICD-10-CM | POA: Diagnosis not present

## 2018-10-17 DIAGNOSIS — M25651 Stiffness of right hip, not elsewhere classified: Secondary | ICD-10-CM | POA: Diagnosis not present

## 2018-10-17 DIAGNOSIS — Z96641 Presence of right artificial hip joint: Secondary | ICD-10-CM | POA: Diagnosis not present

## 2018-10-17 DIAGNOSIS — M6281 Muscle weakness (generalized): Secondary | ICD-10-CM | POA: Diagnosis not present

## 2018-11-14 DIAGNOSIS — R69 Illness, unspecified: Secondary | ICD-10-CM | POA: Diagnosis not present

## 2018-11-19 DIAGNOSIS — M25512 Pain in left shoulder: Secondary | ICD-10-CM | POA: Diagnosis not present

## 2018-11-19 DIAGNOSIS — M1611 Unilateral primary osteoarthritis, right hip: Secondary | ICD-10-CM | POA: Diagnosis not present

## 2018-11-28 DIAGNOSIS — J3489 Other specified disorders of nose and nasal sinuses: Secondary | ICD-10-CM | POA: Diagnosis not present

## 2018-12-12 DIAGNOSIS — R69 Illness, unspecified: Secondary | ICD-10-CM | POA: Diagnosis not present

## 2018-12-19 DIAGNOSIS — M25512 Pain in left shoulder: Secondary | ICD-10-CM | POA: Diagnosis not present

## 2018-12-21 DIAGNOSIS — Z23 Encounter for immunization: Secondary | ICD-10-CM | POA: Diagnosis not present

## 2018-12-26 DIAGNOSIS — R69 Illness, unspecified: Secondary | ICD-10-CM | POA: Diagnosis not present

## 2018-12-27 ENCOUNTER — Ambulatory Visit: Payer: 59 | Admitting: Certified Nurse Midwife

## 2019-01-04 ENCOUNTER — Ambulatory Visit: Payer: 59 | Admitting: Certified Nurse Midwife

## 2019-01-09 DIAGNOSIS — R69 Illness, unspecified: Secondary | ICD-10-CM | POA: Diagnosis not present

## 2019-01-11 ENCOUNTER — Other Ambulatory Visit: Payer: Self-pay

## 2019-01-15 ENCOUNTER — Other Ambulatory Visit: Payer: Self-pay

## 2019-01-15 ENCOUNTER — Encounter: Payer: Self-pay | Admitting: Certified Nurse Midwife

## 2019-01-15 ENCOUNTER — Ambulatory Visit (INDEPENDENT_AMBULATORY_CARE_PROVIDER_SITE_OTHER): Payer: 59 | Admitting: Certified Nurse Midwife

## 2019-01-15 VITALS — BP 118/76 | HR 68 | Temp 97.1°F | Resp 16 | Ht 62.75 in

## 2019-01-15 DIAGNOSIS — Z8679 Personal history of other diseases of the circulatory system: Secondary | ICD-10-CM

## 2019-01-15 DIAGNOSIS — N952 Postmenopausal atrophic vaginitis: Secondary | ICD-10-CM

## 2019-01-15 DIAGNOSIS — Z8639 Personal history of other endocrine, nutritional and metabolic disease: Secondary | ICD-10-CM

## 2019-01-15 DIAGNOSIS — Z23 Encounter for immunization: Secondary | ICD-10-CM

## 2019-01-15 DIAGNOSIS — Z01419 Encounter for gynecological examination (general) (routine) without abnormal findings: Secondary | ICD-10-CM

## 2019-01-15 DIAGNOSIS — N898 Other specified noninflammatory disorders of vagina: Secondary | ICD-10-CM

## 2019-01-15 NOTE — Patient Instructions (Signed)

## 2019-01-15 NOTE — Progress Notes (Signed)
63 y.o. G0P0000 Single  Caucasian Fe here for annual exam. Menopausal no vaginal bleeding or vaginal dryness with coconut oil. Had noted some change in discharge color green , no itching, restarted back coconut oil and not seen again. Sees Dr. Justin Mend for aex, labs, hypertension, hypothyroid and allergy management. All medications stable. Had right hip replacement in June with no issues. Having right shoulder surgery soon. Mother died at home earlier this year, father still grieving. Patient had to seek assisted care for sister who has dementia now. Stressful year. Trying to care for self and "stay together", has support through Hospice. No other health issues today.  No LMP recorded. Patient is postmenopausal.          Sexually active: No.  The current method of family planning is abstinence.    Exercising: Yes.    walking Smoker:  no  Review of Systems  Constitutional: Negative.   HENT: Negative.   Eyes: Negative.   Respiratory: Negative.   Cardiovascular: Negative.   Gastrointestinal: Negative.   Genitourinary: Negative.        Greenish color vaginal discharge  Musculoskeletal: Negative.   Skin: Negative.   Neurological: Negative.   Endo/Heme/Allergies: Negative.   Psychiatric/Behavioral: Negative.     Health Maintenance: Pap:  12-02-14 neg, 12-22-17 neg HPV HR neg History of Abnormal Pap: yes MMG:  05-14-2018 category b density birads 1:neg Self Breast exams: occ Colonoscopy:  2016 neg, declines further BMD:  2018 TDaP: 2010 Shingles: not done Pneumonia: not done Hep C and HIV: both neg 2017 Labs: PCP   reports that she has quit smoking. She has never used smokeless tobacco. She reports current alcohol use. She reports that she does not use drugs.  Past Medical History:  Diagnosis Date  . Abnormal Pap smear of cervix    yrs ago  . Arthritis   . Diverticulitis   . Family history of adverse reaction to anesthesia    sister- gets wild   . Graves disease    in remission   .  Graves' disease without crisis 1997   remission  . History of rheumatic fever age 65  . Hyperlipidemia   . Hypertension   . Hypothyroidism   . Pneumonia    hx  of years ago     Past Surgical History:  Procedure Laterality Date  . CERVICAL CONE BIOPSY     yrs ago  . COCCYX REMOVAL     sledding accident  . KNEE RECONSTRUCTION Right    x 2  . NASAL SEPTUM SURGERY    . TOTAL HIP ARTHROPLASTY Right 08/21/2018   Procedure: TOTAL HIP ARTHROPLASTY ANTERIOR APPROACH;  Surgeon: Melrose Nakayama, MD;  Location: [facility];  Service: Orthopedics;  Laterality: Right;  . TOTAL KNEE ARTHROPLASTY Right 2007    Current Outpatient Medications  Medication Sig Dispense Refill  . amLODipine (NORVASC) 5 MG tablet Take 5 mg by mouth daily.    . cetirizine (ZYRTEC) 10 MG tablet Take 10 mg by mouth at bedtime.     Marland Kitchen levothyroxine (SYNTHROID, LEVOTHROID) 25 MCG tablet Take 25 mcg by mouth daily before breakfast.   11  . losartan (COZAAR) 100 MG tablet Take 100 mg by mouth daily.   3  . Menthol, Topical Analgesic, (BIOFREEZE EX) Apply 1 application topically daily as needed (hip pain).    . metoprolol succinate (TOPROL-XL) 100 MG 24 hr tablet Take 100 mg by mouth daily.   0  . metroNIDAZOLE (METROGEL) 0.75 % gel Apply 1  application topically daily as needed (rosacea).   11  . montelukast (SINGULAIR) 10 MG tablet Take 10 mg by mouth at bedtime.     [name] Glycol-Propyl Glycol (SYSTANE OP) Place 1 drop into both eyes daily as needed (dry eyes).    . simvastatin (ZOCOR) 20 MG tablet Take 20 mg by mouth at bedtime.      No current facility-administered medications for this visit.     Family History  Problem Relation Age of Onset  . Hyperlipidemia Mother   . Hypertension Mother   . Hypertension Father   . Hyperlipidemia Father   . Diabetes Sister   . Hypertension Sister   . Hyperlipidemia Sister   . Hypertension Brother   . Stroke Maternal Grandmother 73  . Heart attack Paternal Grandmother   .  Breast cancer Neg Hx     ROS:  Pertinent items are noted in HPI.  Otherwise, a comprehensive ROS was negative.  Exam:   BP 118/76   Pulse 68   Temp (!) 97.1 F (36.2 C) (Skin)   Resp 16   Ht 5' 2.75" (1.594 m)   BMI 33.03 kg/m  Height: 5' 2.75" (159.4 cm) Ht Readings from Last 3 Encounters:  01/15/19 5' 2.75" (1.594 m)  08/21/18 5\' 3"  (1.6 m)  08/17/18 5\' 3"  (1.6 m)    General appearance: alert, cooperative and appears stated age Head: Normocephalic, without obvious abnormality, atraumatic Neck: no adenopathy, supple, symmetrical, trachea midline and thyroid normal to inspection and palpation Lungs: clear to auscultation bilaterally Breasts: normal appearance, no masses or tenderness, No nipple retraction or dimpling, No nipple discharge or bleeding, No axillary or supraclavicular adenopathy Heart: regular rate and rhythm Abdomen: soft, non-tender; no masses,  no organomegaly Extremities: extremities normal, atraumatic, no cyanosis or edema Skin: Skin color, texture, turgor normal. No rashes or lesions Lymph nodes: Cervical, supraclavicular, and axillary nodes normal. No abnormal inguinal nodes palpated Neurologic: Grossly normal   Pelvic: External genitalia:  no lesions              Urethra:  normal appearing urethra with no masses, tenderness or lesions              Bartholin's and Skene's: normal                 Vagina: atrophic appearing vagina with slightly pale color and slight green tint discharge, no lesions, no odor, non tender              Cervix: no cervical motion tenderness, no lesions and normal appearance              Pap taken: No. Bimanual Exam:  Uterus:  normal size, contour, position, consistency, mobility, non-tender and anteverted              Adnexa: normal adnexa and no mass, fullness, tenderness               Rectovaginal: Confirms               Anus:  normal sphincter tone, no lesions  Chaperone present: yes  A:  Well Woman with normal  exam  Post menopausal no HRT  Change in vaginal discharge color, no symptoms  Atrophic vaginitis using coconut oil now with good results, had stopped prior to color change in vaginal discharge  Immunization due  Social stress with loss of mother and father's grief and sister with dementia now. Patient is caring for all.  P:  Reviewed health and wellness pertinent to exam  Aware of need to call if vaginal bleeding.  Discussed finding and feel related to dryness and atrophy, improved when she started back with coconut oil. Lab: affirm will treat if indicated  Requests TDAP  Stressed self care and importance to her health also.  Pap smear: no   counseled on breast self exam, mammography screening, feminine hygiene, adequate intake of calcium and vitamin D, diet and exercise  return annually or prn  An After Visit Summary was printed and given to the patient.

## 2019-01-16 LAB — VAGINITIS/VAGINOSIS, DNA PROBE
Candida Species: NEGATIVE
Gardnerella vaginalis: NEGATIVE
Trichomonas vaginosis: NEGATIVE

## 2019-01-23 DIAGNOSIS — R69 Illness, unspecified: Secondary | ICD-10-CM | POA: Diagnosis not present

## 2019-01-24 DIAGNOSIS — M1990 Unspecified osteoarthritis, unspecified site: Secondary | ICD-10-CM | POA: Diagnosis not present

## 2019-01-24 DIAGNOSIS — G8918 Other acute postprocedural pain: Secondary | ICD-10-CM | POA: Diagnosis not present

## 2019-01-24 DIAGNOSIS — M7542 Impingement syndrome of left shoulder: Secondary | ICD-10-CM | POA: Diagnosis not present

## 2019-01-24 DIAGNOSIS — M7522 Bicipital tendinitis, left shoulder: Secondary | ICD-10-CM | POA: Diagnosis not present

## 2019-01-24 DIAGNOSIS — M19012 Primary osteoarthritis, left shoulder: Secondary | ICD-10-CM | POA: Diagnosis not present

## 2019-02-04 DIAGNOSIS — M6281 Muscle weakness (generalized): Secondary | ICD-10-CM | POA: Diagnosis not present

## 2019-02-04 DIAGNOSIS — M25612 Stiffness of left shoulder, not elsewhere classified: Secondary | ICD-10-CM | POA: Diagnosis not present

## 2019-02-05 ENCOUNTER — Other Ambulatory Visit: Payer: Self-pay | Admitting: Registered"

## 2019-02-05 DIAGNOSIS — Z20822 Contact with and (suspected) exposure to covid-19: Secondary | ICD-10-CM

## 2019-02-06 DIAGNOSIS — R69 Illness, unspecified: Secondary | ICD-10-CM | POA: Diagnosis not present

## 2019-02-07 LAB — NOVEL CORONAVIRUS, NAA: SARS-CoV-2, NAA: NOT DETECTED

## 2019-02-20 DIAGNOSIS — R69 Illness, unspecified: Secondary | ICD-10-CM | POA: Diagnosis not present

## 2019-03-04 DIAGNOSIS — M9905 Segmental and somatic dysfunction of pelvic region: Secondary | ICD-10-CM | POA: Diagnosis not present

## 2019-03-04 DIAGNOSIS — M545 Low back pain: Secondary | ICD-10-CM | POA: Diagnosis not present

## 2019-03-04 DIAGNOSIS — M9903 Segmental and somatic dysfunction of lumbar region: Secondary | ICD-10-CM | POA: Diagnosis not present

## 2019-03-04 DIAGNOSIS — M9904 Segmental and somatic dysfunction of sacral region: Secondary | ICD-10-CM | POA: Diagnosis not present

## 2019-03-20 DIAGNOSIS — R69 Illness, unspecified: Secondary | ICD-10-CM | POA: Diagnosis not present

## 2019-03-27 DIAGNOSIS — Z20828 Contact with and (suspected) exposure to other viral communicable diseases: Secondary | ICD-10-CM | POA: Diagnosis not present

## 2019-03-27 DIAGNOSIS — Z1159 Encounter for screening for other viral diseases: Secondary | ICD-10-CM | POA: Diagnosis not present

## 2019-04-12 DIAGNOSIS — B029 Zoster without complications: Secondary | ICD-10-CM

## 2019-04-12 HISTORY — DX: Zoster without complications: B02.9

## 2019-04-17 DIAGNOSIS — R69 Illness, unspecified: Secondary | ICD-10-CM | POA: Diagnosis not present

## 2019-04-22 DIAGNOSIS — Z0000 Encounter for general adult medical examination without abnormal findings: Secondary | ICD-10-CM | POA: Diagnosis not present

## 2019-04-22 DIAGNOSIS — E782 Mixed hyperlipidemia: Secondary | ICD-10-CM | POA: Diagnosis not present

## 2019-04-22 DIAGNOSIS — I10 Essential (primary) hypertension: Secondary | ICD-10-CM | POA: Diagnosis not present

## 2019-04-22 DIAGNOSIS — E039 Hypothyroidism, unspecified: Secondary | ICD-10-CM | POA: Diagnosis not present

## 2019-04-27 DIAGNOSIS — Z20828 Contact with and (suspected) exposure to other viral communicable diseases: Secondary | ICD-10-CM | POA: Diagnosis not present

## 2019-04-27 DIAGNOSIS — Z1159 Encounter for screening for other viral diseases: Secondary | ICD-10-CM | POA: Diagnosis not present

## 2019-04-30 ENCOUNTER — Other Ambulatory Visit: Payer: Self-pay | Admitting: Family Medicine

## 2019-04-30 DIAGNOSIS — Z1231 Encounter for screening mammogram for malignant neoplasm of breast: Secondary | ICD-10-CM

## 2019-05-01 DIAGNOSIS — R69 Illness, unspecified: Secondary | ICD-10-CM | POA: Diagnosis not present

## 2019-05-15 DIAGNOSIS — R69 Illness, unspecified: Secondary | ICD-10-CM | POA: Diagnosis not present

## 2019-05-29 DIAGNOSIS — R69 Illness, unspecified: Secondary | ICD-10-CM | POA: Diagnosis not present

## 2019-05-30 ENCOUNTER — Ambulatory Visit: Payer: 59

## 2019-06-05 DIAGNOSIS — B029 Zoster without complications: Secondary | ICD-10-CM | POA: Diagnosis not present

## 2019-06-12 DIAGNOSIS — R69 Illness, unspecified: Secondary | ICD-10-CM | POA: Diagnosis not present

## 2019-06-26 DIAGNOSIS — R69 Illness, unspecified: Secondary | ICD-10-CM | POA: Diagnosis not present

## 2019-06-27 ENCOUNTER — Ambulatory Visit
Admission: RE | Admit: 2019-06-27 | Discharge: 2019-06-27 | Disposition: A | Discharge status: Home / Self Care | Payer: 59 | Source: Ambulatory Visit | Attending: Family Medicine | Admitting: Family Medicine

## 2019-06-27 ENCOUNTER — Other Ambulatory Visit: Payer: Self-pay

## 2019-06-27 DIAGNOSIS — Z1231 Encounter for screening mammogram for malignant neoplasm of breast: Secondary | ICD-10-CM | POA: Diagnosis not present

## 2019-07-02 ENCOUNTER — Encounter: Payer: Self-pay | Admitting: Certified Nurse Midwife

## 2019-07-10 DIAGNOSIS — R69 Illness, unspecified: Secondary | ICD-10-CM | POA: Diagnosis not present

## 2019-07-24 DIAGNOSIS — R69 Illness, unspecified: Secondary | ICD-10-CM | POA: Diagnosis not present

## 2019-08-07 DIAGNOSIS — R69 Illness, unspecified: Secondary | ICD-10-CM | POA: Diagnosis not present

## 2019-08-21 DIAGNOSIS — R69 Illness, unspecified: Secondary | ICD-10-CM | POA: Diagnosis not present

## 2019-09-18 DIAGNOSIS — R69 Illness, unspecified: Secondary | ICD-10-CM | POA: Diagnosis not present

## 2019-10-18 ENCOUNTER — Encounter: Payer: Self-pay | Admitting: *Deleted

## 2019-10-18 ENCOUNTER — Other Ambulatory Visit: Payer: Self-pay | Admitting: *Deleted

## 2019-10-18 NOTE — Patient Outreach (Signed)
[facility]) Care Management [facility] CM Telephone Outreach, routine insurance referral- new patient  10/18/2019  Holly Beard [birth year] [id number]  Unsuccessful initial outreach attempt to Holly Beard, 64 y/o female referred to [facility] RN CM October 10, 2019 by [facility] CMA on insurance referral; patient has had no recent hospitalizations.  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  With call attempt today, phone rang without physical or voice mail pick up; unable to leave patient voice message requesting call back  Plan:  Will place [facility] CM unsuccessful patient outreach letter in mail requesting call back in writing  Will re-attempt [facility] CM telephone outreach within 4 business days if I do not hear back from patient first  Oneta Rack, RN, BSN, [employer] Coordinator [facility] Care Management  [phone]

## 2019-10-21 ENCOUNTER — Other Ambulatory Visit: Payer: Self-pay | Admitting: *Deleted

## 2019-10-21 ENCOUNTER — Encounter: Payer: Self-pay | Admitting: *Deleted

## 2019-10-21 NOTE — Patient Outreach (Signed)
[place] [facility]) Care Management [facility] CM Telephone Outreach, screening- insurance referral, new patient Unsuccessful (consecutive) outreach attempt # 2- new referral  10/21/2019  Holly Beard 1955/08/15 [id number]  Unsuccessful second consecutive outreach attempt to [name], 64 y/o female referred to [facility] RN CM October 10, 2019 by [facility] CMA on insurance referral; patient has had no recent hospitalizations.  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  With call attempt today, HIPAA compliant voice mail message left for patient, requesting return call back.  Plan:  Verified [facility] CM unsuccessful patient outreach letter in mail requesting call back in writing on October 18, 2019  Will re-attempt [facility] CM telephone outreach within 4 business days if I do not hear back from patient first  Oneta Rack, RN, BSN, [employer] Coordinator [facility] Care Management  [phone]

## 2019-10-23 ENCOUNTER — Other Ambulatory Visit: Payer: Self-pay | Admitting: *Deleted

## 2019-10-23 ENCOUNTER — Encounter: Payer: Self-pay | Admitting: *Deleted

## 2019-10-23 NOTE — Patient Outreach (Signed)
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  With call attempt today, HIPAA compliant voice mail message left for patient, requesting return call back.  Plan:  Verified [facility] CM unsuccessful patient outreach letter in mail requesting call back in writing on October 18, 2019  Will re-attempt [facility] CM telephone outreachwithin 4 weeks for final attemptif I do not hear back from patient first  Oneta Rack, RN, BSN, [employer] Coordinator [facility] Care Management  [phone]

## 2019-11-20 ENCOUNTER — Other Ambulatory Visit: Payer: Self-pay | Admitting: *Deleted

## 2019-11-20 ENCOUNTER — Encounter: Payer: Self-pay | Admitting: *Deleted

## 2019-11-20 NOTE — Patient Outreach (Signed)
[facility] [facility]) Care Management 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  Patient returned my call from earlier today; HIPAA/ identity verified and purpose of call/ [facility] CM services were discussed with patient today, who now provides verbal consent for [facility] CM involvement in her care and participation in HTN initiative through her insurance provider.  Patient reports currently at work; states that she works full time at [employer] as [place]; reports that she has had HTN for several years and does not currently monitor/ record blood pressures at home; tries to "loosely follow" low salt diet and has resumed walking for health.  Reports would like to lose 20-25 pounds and is agreeable to begin monitoring and recording blood pressures at home with both activity and at rest.  States she believes she may have "lost about 3 pounds" over the last few weeks now that she has resumed walking; confirms that she does not monitor her weights at home regularly and this was also encouraged as she participates in the HTN initiative.  She expresses interest in following DASH/ low salt diet and improving her dietary routines for self-health management of HTN.  Patient denies care coordination/ management/ [employer], and pharmacy needs and no concerns were identified as a result of successful screening call today.  Discussed referral to [place] for ongoing disease management follow up and patient is agreeable to this.  Patient denies further issues, concerns, or problems today.  I provided/ confirmed that patient has my direct  phone number, the main [facility] CM office phone number, and the [facility] CM 24-hour nurse advice phone number should issues arise prior to next scheduled [place] outreach within 4 weeks.  Encouraged patient to contact me directly if needs, questions, issues, or concerns arise prior to [place] outreach; patient agreed to do so.  Plan:  Will make patient active with [facility] CM program and will make patient's PCP aware of same- will send PCP barriers letter  Will mail patient printed educational material around [place] and low-salt diet  Will mail patient blood pressure cuff through [employer] HTN initiaitve  Patient will begin monitoring/ recording blood pressures at home 3-4 times per week, both at rest and with activity, and will engage with [place] for ongoing follow up of disease management of chronic disease state of HTN  I will place [place] referral  Oneta Rack, RN, BSN, [place] Care Management  [phone]

## 2019-11-20 NOTE — Patient Outreach (Signed)
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  With call attempt today,HIPAA compliant voice mail message left for patient, requesting return call back  Plan:  VerifiedTHN CM unsuccessful patient outreach letter in mail requesting call back in writing on October 18, 2019  Will make patient inactive with [facility] CM program as this is the fourth consecutive unsuccessful outreach attempt over 4 weeks without patient call-back, and will make patient's PCP aware of same- will send case closure PCP letter  Oneta Rack, RN, BSN, [place] Care Management  [phone]

## 2019-11-21 ENCOUNTER — Other Ambulatory Visit: Payer: Self-pay | Admitting: *Deleted

## 2019-12-18 ENCOUNTER — Other Ambulatory Visit: Payer: Self-pay | Admitting: *Deleted

## 2019-12-18 ENCOUNTER — Encounter: Payer: Self-pay | Admitting: *Deleted

## 2019-12-18 NOTE — Patient Outreach (Signed)
[place] [facility]) Care Management  [place]  12/18/2019   Tunya Held 07/05/1955 [id number]  Subjective: Successful telephone outreach call to patient. HIPAA identifiers obtained. Patient States she is doing well. She did receive the B/P cuff and hypertension education in the mail and has started to take her B/P most days; admitting she forgets at times. Patient's B/P ranges are systolic 237-628 to diastolic 70- mid 31'D. Patient reports that she does limit her sodium intake and plans to increase her physical activity by walking and riding her recumbent bicycle more routinely. She is motivated to lose around 15 pounds to help improve her overall health. Patient reports that her home environment is safe and is in a good place emotionally. Patient did confirm that she has this nurse's contact information and states she will call if needed.   Encounter Medications:  Outpatient Encounter Medications as of 12/18/2019  Medication Sig  . amLODipine (NORVASC) 5 MG tablet Take 5 mg by mouth daily.  . cetirizine (ZYRTEC) 10 MG tablet Take 10 mg by mouth at bedtime.   . diclofenac Sodium (VOLTAREN) 1 % GEL Apply 2 g topically 4 (four) times daily.  Marland Kitchen levothyroxine (SYNTHROID, LEVOTHROID) 25 MCG tablet Take 25 mcg by mouth daily before breakfast.   . losartan (COZAAR) 100 MG tablet Take 100 mg by mouth daily.   . Melatonin 10 MG SUBL Place 10 mg under the tongue.  . Menthol, Topical Analgesic, (BIOFREEZE EX) Apply 1 application topically daily as needed (hip pain).   . metoprolol succinate (TOPROL-XL) 100 MG 24 hr tablet Take 100 mg by mouth daily.   . metroNIDAZOLE (METROGEL) 0.75 % gel Apply 1 application topically daily as needed (rosacea).   . montelukast (SINGULAIR) 10 MG tablet Take 10 mg by mouth at bedtime.   Marland Kitchen omeprazole (PRILOSEC OTC) 20 MG tablet Take 20 mg by mouth daily.  [name] Glycol-Propyl Glycol (SYSTANE OP) Place 1 drop into both eyes daily as needed (dry  eyes).  . simvastatin (ZOCOR) 20 MG tablet Take 20 mg by mouth at bedtime.    No facility-administered encounter medications on file as of 12/18/2019.    Functional Status:  No flowsheet data found.  Fall/Depression Screening: Fall Risk  12/18/2019 11/20/2019  Falls in the past year? 0 0  Number falls in past yr: 0 0  Injury with Fall? 0 0  Comment - N/A- no falls reported  Risk for fall due to : - No Fall Risks  Follow up Falls evaluation completed Falls prevention discussed   PHQ 2/9 Scores 12/18/2019 11/20/2019  PHQ - 2 Score 0 0   Goals Addressed            This Visit's Progress   . Patient will maintain B/P below value of 150/80 within the next 90 days       [place] (see longtitudinal plan of care for additional care plan information)  Objective:  . Last practice recorded BP readings:  BP Readings from Last 3 Encounters:  01/15/19 118/76  08/22/18 120/69  08/17/18 130/63 .   Marland Kitchen Most recent eGFR/CrCl: No results found for: EGFR  No components found for: CRCL  Current Barriers:  Marland Kitchen Knowledge deficit related to self care management of hypertension  Case Manager Clinical Goal(s):  Marland Kitchen Over the next 90 days, patient will verbalize understanding of plan for hypertension management . Over the next 90 days, patient will not experience hospital admission. Hospital Admissions in last 6 months =  0 . Over the next 90 days, patient will attend all scheduled medical appointments:   . Over the next 90 days, patient will demonstrate improved adherence to prescribed treatment plan for hypertension as evidenced by taking all medications as prescribed, monitoring and recording blood pressure as directed, adhering to low sodium/DASH diet . Patient will increase her physical activity by walking and riding her recumbent bicycle routinely  Interventions:  . Evaluation of current treatment plan related to hypertension self management and patient's adherence to plan as established by  provider. . Reviewed medications with patient and discussed importance of compliance . Provided education regarding s/s of DASH diet/Low salt diet . Provided education regarding increasing physical activity . Discussed signs and symptoms of hypertension . Patient received written hypertension education from previous CM  Patient Self Care Activities:  . Self administers medications as prescribed . Attends all scheduled provider appointments . Calls provider office for new concerns, questions, or BP outside discussed parameters . Monitors BP and records as discussed . Adheres to a low sodium diet/DASH diet . Increase physical activity as tolerated . Verbalize where to go to receive medical care  Initial goal documentation         Plan: [place] will send PCP a barrier letter and today's assessment note, will call patient within the month of December, and patient agrees to future outreach calls.   [name] RN, BSN [place] [phone] [email or web address]

## 2020-01-01 DIAGNOSIS — M25531 Pain in right wrist: Secondary | ICD-10-CM | POA: Diagnosis not present

## 2020-01-20 ENCOUNTER — Ambulatory Visit: Payer: 59 | Admitting: Certified Nurse Midwife

## 2020-01-21 ENCOUNTER — Ambulatory Visit: Payer: 59 | Admitting: Obstetrics & Gynecology

## 2020-01-27 NOTE — Progress Notes (Deleted)
64 y.o. G0P0000 Single Caucasian female here for annual exam.    PCP:     No LMP recorded. Patient is postmenopausal.           Sexually active: {yes no:314532}  The current method of family planning is post menopausal status.    Exercising: {yes no:314532}  {types:19826} Smoker:  no  Health Maintenance: Pap: 12-22-17 Neg:Neg HR HPV, 12-02-14 Neg, 11-22-13 Neg:Neg HR HPV History of abnormal Pap:  Yes,  MMG: 06-27-19 Neg/density B/BiRads1 Colonoscopy: 2016 Normal;declines further BMD: ***01-24-17  Result :Normal TDaP: 01-15-19 Gardasil:   no HIV: 2017 NR Hep C: 2017 Neg Screening Labs:  Hb today: ***, Urine today: ***   reports that she has quit smoking. She has never used smokeless tobacco. She reports current alcohol use. She reports that she does not use drugs.  Past Medical History:  Diagnosis Date  . Abnormal Pap smear of cervix    yrs ago  . Arthritis   . Diverticulitis   . Family history of adverse reaction to anesthesia    sister- gets wild   . Graves disease    in remission   . Graves' disease without crisis 1997   remission  . History of rheumatic fever age 23  . Hyperlipidemia   . Hypertension   . Hypothyroidism   . Pneumonia    hx  of years ago     Past Surgical History:  Procedure Laterality Date  . CERVICAL CONE BIOPSY     yrs ago  . COCCYX REMOVAL     sledding accident  . KNEE RECONSTRUCTION Right    x 2  . NASAL SEPTUM SURGERY    . TOTAL HIP ARTHROPLASTY Right 08/21/2018   Procedure: TOTAL HIP ARTHROPLASTY ANTERIOR APPROACH;  Surgeon: Melrose Nakayama, MD;  Location: [facility];  Service: Orthopedics;  Laterality: Right;  . TOTAL KNEE ARTHROPLASTY Right 2007    Current Outpatient Medications  Medication Sig Dispense Refill  . amLODipine (NORVASC) 5 MG tablet Take 5 mg by mouth daily.    . cetirizine (ZYRTEC) 10 MG tablet Take 10 mg by mouth at bedtime.     . diclofenac Sodium (VOLTAREN) 1 % GEL Apply 2 g topically 4 (four) times daily.    Marland Kitchen  levothyroxine (SYNTHROID, LEVOTHROID) 25 MCG tablet Take 25 mcg by mouth daily before breakfast.   11  . losartan (COZAAR) 100 MG tablet Take 100 mg by mouth daily.   3  . Melatonin 10 MG SUBL Place 10 mg under the tongue.    . Menthol, Topical Analgesic, (BIOFREEZE EX) Apply 1 application topically daily as needed (hip pain).     . metoprolol succinate (TOPROL-XL) 100 MG 24 hr tablet Take 100 mg by mouth daily.   0  . metroNIDAZOLE (METROGEL) 0.75 % gel Apply 1 application topically daily as needed (rosacea).   11  . montelukast (SINGULAIR) 10 MG tablet Take 10 mg by mouth at bedtime.     Marland Kitchen omeprazole (PRILOSEC OTC) 20 MG tablet Take 20 mg by mouth daily.    [name] Glycol-Propyl Glycol (SYSTANE OP) Place 1 drop into both eyes daily as needed (dry eyes).    . simvastatin (ZOCOR) 20 MG tablet Take 20 mg by mouth at bedtime.      No current facility-administered medications for this visit.    Family History  Problem Relation Age of Onset  . Hyperlipidemia Mother   . Hypertension Mother   . Hypertension Father   . Hyperlipidemia Father   .  Diabetes Sister   . Hypertension Sister   . Hyperlipidemia Sister   . Hypertension Brother   . Stroke Maternal Grandmother 73  . Heart attack Paternal Grandmother   . Breast cancer Neg Hx     Review of Systems  Exam:   There were no vitals taken for this visit.    General appearance: alert, cooperative and appears stated age Head: normocephalic, without obvious abnormality, atraumatic Neck: no adenopathy, supple, symmetrical, trachea midline and thyroid normal to inspection and palpation Lungs: clear to auscultation bilaterally Breasts: normal appearance, no masses or tenderness, No nipple retraction or dimpling, No nipple discharge or bleeding, No axillary adenopathy Heart: regular rate and rhythm Abdomen: soft, non-tender; no masses, no organomegaly Extremities: extremities normal, atraumatic, no cyanosis or edema Skin: skin color,  texture, turgor normal. No rashes or lesions Lymph nodes: cervical, supraclavicular, and axillary nodes normal. Neurologic: grossly normal  Pelvic: External genitalia:  no lesions              No abnormal inguinal nodes palpated.              Urethra:  normal appearing urethra with no masses, tenderness or lesions              Bartholins and Skenes: normal                 Vagina: normal appearing vagina with normal color and discharge, no lesions              Cervix: no lesions              Pap taken: {yes no:314532} Bimanual Exam:  Uterus:  normal size, contour, position, consistency, mobility, non-tender              Adnexa: no mass, fullness, tenderness              Rectal exam: {yes no:314532}.  Confirms.              Anus:  normal sphincter tone, no lesions  Chaperone was present for exam.  Assessment:   Well woman visit with normal exam.   Plan: Mammogram screening discussed. Self breast awareness reviewed. Pap and HR HPV as above. Guidelines for Calcium, Vitamin D, regular exercise program including cardiovascular and weight bearing exercise.   Follow up annually and prn.   Additional counseling given.  {yes [id number]. _______ minutes face to face time of which over 50% was spent in counseling.    After visit summary provided.

## 2020-01-28 ENCOUNTER — Ambulatory Visit: Payer: 59 | Admitting: Obstetrics and Gynecology

## 2020-02-03 ENCOUNTER — Ambulatory Visit: Payer: 59 | Admitting: Obstetrics & Gynecology

## 2020-02-10 NOTE — Progress Notes (Signed)
64 y.o. G0P0000 Single Caucasian female here for annual exam.    Having some vaginal dryness.  Using coconut oil, which is helpful.  Has tiny abscesses on her buttocks.  Had some bloody drainage.  Not painful.  These do not occur elsewhere on her skin.   Denies vaginal bleeding or spotting.   Had shingles this year.   Completed Covid vaccination including booster. Completed flu vaccine.   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  Had a difficult year last year.  Mother passed away.  Sister 13 yo has dementia. Patient does have a therapist and shares she has good self awareness. Neighbors are supportive.   PCP: Maurice Small, MD    Patient's last menstrual period was 04/12/2003 (approximate).           Sexually active: No.  The current method of family planning is post menopausal status.    Exercising: Yes.    walking and recumbent bike Smoker:  no  Health Maintenance: Pap: 12-22-17 Neg:Neg HR HPV, 12-02-14 Neg, 11-22-13 Neg:Neg HR HPV History of abnormal Pap: Yes, years ago--had colposcopy and repeat pap--no treatment MMG: 06-27-19 Neg/density B/Birads1 Colonoscopy: 2016 normal--declines further.   BMD: 01-24-17 Result : Normal TDaP:  01-15-19 Gardasil:   no HIV: 2017 NR Hep C:2017 Neg Screening Labs:   PCP.   reports that she has quit smoking. She has never used smokeless tobacco. She reports current alcohol use. She reports that she does not use drugs.  Past Medical History:  Diagnosis Date  . Abnormal Pap smear of cervix    yrs ago  . Arthritis   . Diverticulitis   . Family history of adverse reaction to anesthesia    sister- gets wild   . Graves disease    in remission   . Graves' disease without crisis 1997   remission  . History of rheumatic fever age 70  . Hyperlipidemia   . Hypertension   . Hypothyroidism   . Pneumonia    hx  of years ago   . Shingles 2021    Past Surgical History:  Procedure Laterality Date  . CERVICAL CONE BIOPSY     yrs ago  .  COCCYX REMOVAL     sledding accident  . KNEE RECONSTRUCTION Right    x 2  . NASAL SEPTUM SURGERY    . TOTAL HIP ARTHROPLASTY Right 08/21/2018   Procedure: TOTAL HIP ARTHROPLASTY ANTERIOR APPROACH;  Surgeon: Melrose Nakayama, MD;  Location: [facility];  Service: Orthopedics;  Laterality: Right;  . TOTAL KNEE ARTHROPLASTY Right 2007    Current Outpatient Medications  Medication Sig Dispense Refill  . amLODipine (NORVASC) 5 MG tablet Take 5 mg by mouth daily.    . cetirizine (ZYRTEC) 10 MG tablet Take 10 mg by mouth at bedtime.     . cyclobenzaprine (FLEXERIL) 10 MG tablet Take 10 mg by mouth 3 (three) times daily as needed.    . diclofenac Sodium (VOLTAREN) 1 % GEL Apply 2 g topically 4 (four) times daily.    Marland Kitchen HYDROcodone-acetaminophen (NORCO/VICODIN) 5-325 MG tablet Take 1 tablet by mouth every 6 (six) hours as needed for moderate pain.    Marland Kitchen levothyroxine (SYNTHROID, LEVOTHROID) 25 MCG tablet Take 25 mcg by mouth daily before breakfast.   11  . losartan (COZAAR) 100 MG tablet Take 100 mg by mouth daily.   3  . Melatonin 10 MG SUBL Place 10 mg under the tongue.    . Menthol, Topical Analgesic, (BIOFREEZE EX) Apply 1 application topically  daily as needed (hip pain).     . metoprolol succinate (TOPROL-XL) 100 MG 24 hr tablet Take 100 mg by mouth daily.   0  . metroNIDAZOLE (METROGEL) 0.75 % gel Apply 1 application topically daily as needed (rosacea).   11  . montelukast (SINGULAIR) 10 MG tablet Take 10 mg by mouth at bedtime.     Marland Kitchen omeprazole (PRILOSEC OTC) 20 MG tablet Take 20 mg by mouth daily.    [name] Glycol-Propyl Glycol (SYSTANE OP) Place 1 drop into both eyes daily as needed (dry eyes).    . simvastatin (ZOCOR) 20 MG tablet Take 20 mg by mouth at bedtime.      No current facility-administered medications for this visit.    Family History  Problem Relation Age of Onset  . Hyperlipidemia Mother   . Hypertension Mother   . Hypertension Father   . Hyperlipidemia Father   .  Diabetes Sister   . Hypertension Sister   . Hyperlipidemia Sister   . Hypertension Brother   . Stroke Maternal Grandmother 73  . Heart attack Paternal Grandmother   . Breast cancer Neg Hx     Review of Systems  All other systems reviewed and are negative.   Exam:   BP 138/82 (Cuff Size: Large)   Pulse 90   Ht 5' 2.75" (1.594 m)   LMP 04/12/2003 (Approximate)   SpO2 99%   BMI 33.03 kg/m     Patient declined weight check.  General appearance: alert, cooperative and appears stated age Head: normocephalic, without obvious abnormality, atraumatic Neck: no adenopathy, supple, symmetrical, trachea midline and thyroid normal to inspection and palpation Lungs: clear to auscultation bilaterally Breasts: normal appearance, no masses or tenderness, No nipple retraction or dimpling, No nipple discharge or bleeding, No axillary adenopathy Heart: regular rate and rhythm Abdomen: soft, non-tender; no masses, no organomegaly Extremities: extremities normal, atraumatic, no cyanosis or edema Skin: skin color, texture, turgor normal. No rashes or lesions.  Right medial buttock with mild purple skin change and underlying 4 mm firm area consistent with sebaceous cyst.  Lymph nodes: cervical, supraclavicular, and axillary nodes normal. Neurologic: grossly normal  Pelvic: External genitalia:  no lesions              No abnormal inguinal nodes palpated.              Urethra:  normal appearing urethra with no masses, tenderness or lesions              Bartholins and Skenes: normal                 Vagina: normal appearing vagina with normal color and discharge, no lesions              Cervix: no lesions              Pap taken: No. Bimanual Exam:  Uterus:  normal size, contour, position, consistency, mobility, non-tender              Adnexa: no mass, fullness, tenderness              Rectal exam: Yes.  .  Confirms.              Anus:  normal sphincter tone, no lesions  Chaperone was present for  exam.  Assessment:   Well woman visit with normal exam. Hx of cervical cone biopsy in her 86s or 29 yos.  Sebaceous cyst.   Plan: Mammogram screening discussed.  Self breast awareness reviewed. Pap and HR HPV 2022. Guidelines for Calcium, Vitamin D, regular exercise program including cardiovascular and weight bearing exercise.  Follow up annually and prn.

## 2020-02-11 ENCOUNTER — Other Ambulatory Visit: Payer: Self-pay

## 2020-02-11 ENCOUNTER — Ambulatory Visit (INDEPENDENT_AMBULATORY_CARE_PROVIDER_SITE_OTHER): Payer: 59 | Admitting: Obstetrics and Gynecology

## 2020-02-11 ENCOUNTER — Encounter: Payer: Self-pay | Admitting: Obstetrics and Gynecology

## 2020-02-11 VITALS — BP 138/82 | HR 90 | Ht 62.75 in

## 2020-02-11 DIAGNOSIS — Z01419 Encounter for gynecological examination (general) (routine) without abnormal findings: Secondary | ICD-10-CM | POA: Diagnosis not present

## 2020-02-11 NOTE — Patient Instructions (Signed)

## 2020-03-17 ENCOUNTER — Other Ambulatory Visit: Payer: 59 | Admitting: *Deleted

## 2020-03-17 NOTE — Patient Outreach (Signed)
[facility]) Care Management  03/17/2020  Holly Beard 1955/07/04 [id number]  Unsuccessful outreach attempt made to patient. Patient answered the phone and stated she could not speak she was currently busy at work.   Plan: RN Health Coach will call patient within the month of January and patient agrees to future outreach calls.   [name] RN, BSN [name] [phone] [name]

## 2020-03-19 ENCOUNTER — Ambulatory Visit: Payer: 59 | Admitting: *Deleted

## 2020-04-27 ENCOUNTER — Other Ambulatory Visit: Payer: Self-pay | Admitting: *Deleted

## 2020-04-27 NOTE — Patient Outreach (Signed)
[name]) Care Management  [place]  04/27/2020   Holly Beard 05/28/1955 [id number]  Subjective: Successful telephone outreach call to patient. HIPAA identifiers obtained. Patient states she is doing good. Patient states that she has increased her physical activity by walking her dog and riding her recumbent bicycle routinely. Patient reports that she has lost 4 pounds and continues to work towards losing weight by making healthier food choices and exercise. Patient shares that she has not been taking her B/P.  She does report that her B.P was 128/72 last week when she gave blood. Nurse discussed the importance of monitoring her B.P to assure that it is in control. Patient agreed and stated she would move her B.P cuff to a location where she would better see it to remind her and that she would begin to monitor her B/P at least weekly. Patient did not have any further questions or concerns today and did confirm that the patient has this nurse's contact number to call her if needed.   Encounter Medications:  Outpatient Encounter Medications as of 04/27/2020  Medication Sig  . amLODipine (NORVASC) 5 MG tablet Take 5 mg by mouth daily.  . cetirizine (ZYRTEC) 10 MG tablet Take 10 mg by mouth at bedtime.   . cyclobenzaprine (FLEXERIL) 10 MG tablet Take 10 mg by mouth 3 (three) times daily as needed.  . diclofenac Sodium (VOLTAREN) 1 % GEL Apply 2 g topically 4 (four) times daily.  Marland Kitchen HYDROcodone-acetaminophen (NORCO/VICODIN) 5-325 MG tablet Take 1 tablet by mouth every 6 (six) hours as needed for moderate pain.  Marland Kitchen levothyroxine (SYNTHROID, LEVOTHROID) 25 MCG tablet Take 25 mcg by mouth daily before breakfast.   . losartan (COZAAR) 100 MG tablet Take 100 mg by mouth daily.   . Melatonin 10 MG SUBL Place 10 mg under the tongue.  . Menthol, Topical Analgesic, (BIOFREEZE EX) Apply 1 application topically daily as needed (hip pain).   . metoprolol succinate (TOPROL-XL)  100 MG 24 hr tablet Take 100 mg by mouth daily.   . metroNIDAZOLE (METROGEL) 0.75 % gel Apply 1 application topically daily as needed (rosacea).   . montelukast (SINGULAIR) 10 MG tablet Take 10 mg by mouth at bedtime.   Marland Kitchen omeprazole (PRILOSEC OTC) 20 MG tablet Take 20 mg by mouth daily.  [name] Glycol-Propyl Glycol (SYSTANE OP) Place 1 drop into both eyes daily as needed (dry eyes).  . simvastatin (ZOCOR) 20 MG tablet Take 20 mg by mouth at bedtime.    No facility-administered encounter medications on file as of 04/27/2020.    Functional Status:  No flowsheet data found.  Fall/Depression Screening: Fall Risk  04/27/2020 04/27/2020 12/18/2019  Falls in the past year? 0 0 0  Number falls in past yr: 0 0 0  Injury with Fall? 0 0 0  Comment - - -  Risk for fall due to : - - -  Follow up Falls evaluation completed Falls evaluation completed Falls evaluation completed   PHQ 2/9 Scores 12/18/2019 11/20/2019  PHQ - 2 Score 0 0    Assessment:  Goals Addressed            This Visit's Progress   . Patient will maintain B/P below value of 150/80 within the next 90 days   On track    [place] (see longtitudinal plan of care for additional care plan information)  Objective:  . Last practice recorded BP readings:  BP Readings from Last 3 Encounters:  01/15/19 118/76  08/22/18 120/69  08/17/18 130/63 .   Marland Kitchen Most recent eGFR/CrCl: No results found for: EGFR  No components found for: CRCL  Current Barriers:  Marland Kitchen Knowledge deficit related to self care management of hypertension  Case Manager Clinical Goal(s):  Marland Kitchen Over the next 90 days, patient will verbalize understanding of plan for hypertension management . Over the next 90 days, patient will not experience hospital admission. Hospital Admissions in last 6 months = 0 . Over the next 90 days, patient will attend all scheduled medical appointments:   . Over the next 90 days, patient will demonstrate improved adherence to prescribed  treatment plan for hypertension as evidenced by taking all medications as prescribed, monitoring and recording blood pressure as directed, adhering to low sodium/DASH diet . Patient will increase her physical activity by walking and riding her recumbent bicycle routinely  Interventions:  . Evaluation of current treatment plan related to hypertension self management and patient's adherence to plan as established by provider. . Reviewed medications with patient and discussed importance of compliance . Provided education regarding s/s of DASH diet/Low salt diet . Provided education regarding increasing physical activity . Discussed signs and symptoms of hypertension . Patient received written hypertension education from previous CM  Patient Self Care Activities:  . Self administers medications as prescribed . Attends all scheduled provider appointments . Calls provider office for new concerns, questions, or BP outside discussed parameters . Adheres to a low sodium diet/DASH diet . Increase physical activity as tolerated . Verbalize where to go to receive medical care . Patient reports that she will begin to monitor her B/P at least weekly  Please see past updates related to this goal by clicking on the "Past Updates" button in the selected goal   Timeframe:  Long-Range Goal Priority:  High Start Date:   12/18/19                          Expected End Date:   10/08/20 Follow Up Date: 08/08/20 Updated: 04/27/20                          . Track and Manage My Blood Pressure-Hypertension       Timeframe:  Long-Range Goal Priority:  High Start Date:  04/27/20                           Expected End Date: 10/08/20                     Follow Up Date 08/08/20    - check blood pressure weekly - write blood pressure results in a log or diary    Why is this important?    You won't feel high blood pressure, but it can still hurt your blood vessels.   High blood pressure can cause heart or kidney  problems. It can also cause a stroke.   Making lifestyle changes like losing a little weight or eating less salt will help.   Checking your blood pressure at home and at different times of the day can help to control blood pressure.   If the doctor prescribes medicine remember to take it the way the doctor ordered.   Call the office if you cannot afford the medicine or if there are questions about it.     Notes: Patient reports that she will begin to  take her B/P weekly.        Plan: RN Health Coach will send PCP today's assessment note and will call patient within the month of April. Follow-up:  Patient agrees to Care Plan and Follow-up.    [name] RN, BSN [place] [phone] [email or web address]

## 2020-04-27 NOTE — Patient Instructions (Signed)
Goals Addressed            This Visit's Progress   . Patient will maintain B/P below value of 150/80 within the next 90 days   On track    [place] (see longtitudinal plan of care for additional care plan information)  Objective:  . Last practice recorded BP readings:  BP Readings from Last 3 Encounters:  01/15/19 118/76  08/22/18 120/69  08/17/18 130/63 .   Marland Kitchen Most recent eGFR/CrCl: No results found for: EGFR  No components found for: CRCL  Current Barriers:  Marland Kitchen Knowledge deficit related to self care management of hypertension  Case Manager Clinical Goal(s):  Marland Kitchen Over the next 90 days, patient will verbalize understanding of plan for hypertension management . Over the next 90 days, patient will not experience hospital admission. Hospital Admissions in last 6 months = 0 . Over the next 90 days, patient will attend all scheduled medical appointments:   . Over the next 90 days, patient will demonstrate improved adherence to prescribed treatment plan for hypertension as evidenced by taking all medications as prescribed, monitoring and recording blood pressure as directed, adhering to low sodium/DASH diet . Patient will increase her physical activity by walking and riding her recumbent bicycle routinely  Interventions:  . Evaluation of current treatment plan related to hypertension self management and patient's adherence to plan as established by provider. . Reviewed medications with patient and discussed importance of compliance . Provided education regarding s/s of DASH diet/Low salt diet . Provided education regarding increasing physical activity . Discussed signs and symptoms of hypertension . Patient received written hypertension education from previous CM  Patient Self Care Activities:  . Self administers medications as prescribed . Attends all scheduled provider appointments . Calls provider office for new concerns, questions, or BP outside discussed  parameters . Adheres to a low sodium diet/DASH diet . Increase physical activity as tolerated . Verbalize where to go to receive medical care . Patient reports that she will begin to monitor her B/P at least weekly  Please see past updates related to this goal by clicking on the "Past Updates" button in the selected goal   Timeframe:  Long-Range Goal Priority:  High Start Date:   12/18/19                          Expected End Date:   10/08/20 Follow Up Date: 08/08/20 Updated: 04/27/20                          . Track and Manage My Blood Pressure-Hypertension       Timeframe:  Long-Range Goal Priority:  High Start Date:  04/27/20                           Expected End Date: 10/08/20                     Follow Up Date 08/08/20    - check blood pressure weekly - write blood pressure results in a log or diary    Why is this important?    You won't feel high blood pressure, but it can still hurt your blood vessels.   High blood pressure can cause heart or kidney problems. It can also cause a stroke.   Making lifestyle changes like losing a little weight or eating less salt will  help.   Checking your blood pressure at home and at different times of the day can help to control blood pressure.   If the doctor prescribes medicine remember to take it the way the doctor ordered.   Call the office if you cannot afford the medicine or if there are questions about it.     Notes: Patient reports that she will begin to take her B/P weekly.

## 2020-05-12 DIAGNOSIS — Z0000 Encounter for general adult medical examination without abnormal findings: Secondary | ICD-10-CM | POA: Diagnosis not present

## 2020-05-12 DIAGNOSIS — E039 Hypothyroidism, unspecified: Secondary | ICD-10-CM | POA: Diagnosis not present

## 2020-05-12 DIAGNOSIS — E782 Mixed hyperlipidemia: Secondary | ICD-10-CM | POA: Diagnosis not present

## 2020-05-12 DIAGNOSIS — I10 Essential (primary) hypertension: Secondary | ICD-10-CM | POA: Diagnosis not present

## 2020-05-12 DIAGNOSIS — R1013 Epigastric pain: Secondary | ICD-10-CM | POA: Diagnosis not present

## 2020-05-14 ENCOUNTER — Other Ambulatory Visit: Payer: Self-pay | Admitting: Family Medicine

## 2020-05-14 DIAGNOSIS — Z1231 Encounter for screening mammogram for malignant neoplasm of breast: Secondary | ICD-10-CM

## 2020-05-15 ENCOUNTER — Other Ambulatory Visit: Payer: Self-pay | Admitting: Family Medicine

## 2020-05-15 DIAGNOSIS — R1011 Right upper quadrant pain: Secondary | ICD-10-CM

## 2020-05-28 ENCOUNTER — Other Ambulatory Visit: Payer: Self-pay

## 2020-05-28 ENCOUNTER — Ambulatory Visit
Admission: RE | Admit: 2020-05-28 | Discharge: 2020-05-28 | Disposition: A | Discharge status: Home / Self Care | Payer: 59 | Source: Ambulatory Visit | Attending: Family Medicine | Admitting: Family Medicine

## 2020-05-28 DIAGNOSIS — K760 Fatty (change of) liver, not elsewhere classified: Secondary | ICD-10-CM | POA: Diagnosis not present

## 2020-05-28 DIAGNOSIS — R1011 Right upper quadrant pain: Secondary | ICD-10-CM

## 2020-06-11 DIAGNOSIS — H0102A Squamous blepharitis right eye, upper and lower eyelids: Secondary | ICD-10-CM | POA: Diagnosis not present

## 2020-06-11 DIAGNOSIS — H40013 Open angle with borderline findings, low risk, bilateral: Secondary | ICD-10-CM | POA: Diagnosis not present

## 2020-06-11 DIAGNOSIS — H2513 Age-related nuclear cataract, bilateral: Secondary | ICD-10-CM | POA: Diagnosis not present

## 2020-06-11 DIAGNOSIS — H0102B Squamous blepharitis left eye, upper and lower eyelids: Secondary | ICD-10-CM | POA: Diagnosis not present

## 2020-06-30 ENCOUNTER — Other Ambulatory Visit: Payer: Self-pay

## 2020-06-30 ENCOUNTER — Ambulatory Visit
Admission: RE | Admit: 2020-06-30 | Discharge: 2020-06-30 | Disposition: A | Discharge status: Home / Self Care | Payer: 59 | Source: Ambulatory Visit | Attending: Family Medicine | Admitting: Family Medicine

## 2020-06-30 DIAGNOSIS — Z1231 Encounter for screening mammogram for malignant neoplasm of breast: Secondary | ICD-10-CM | POA: Diagnosis not present

## 2020-07-30 ENCOUNTER — Other Ambulatory Visit: Payer: Self-pay | Admitting: *Deleted

## 2020-07-30 NOTE — Patient Outreach (Signed)
[facility]) Care Management  07/30/2020  Holly Beard Dec 18, 1955 [id number]  Nurse was informed that the patient is no longer eligible to participate in the [facility] Program due to insurance change from [place] to [place].   Plan: Nurse will close case, send PCP a case close physician letter, and send the patient a case closed patient letter.  [name] RN, [place] [phone] [name]

## 2020-09-29 ENCOUNTER — Other Ambulatory Visit: Payer: Self-pay | Admitting: Family Medicine

## 2020-09-29 DIAGNOSIS — R109 Unspecified abdominal pain: Secondary | ICD-10-CM

## 2020-10-13 ENCOUNTER — Other Ambulatory Visit: Payer: Self-pay

## 2020-10-13 ENCOUNTER — Ambulatory Visit
Admission: RE | Admit: 2020-10-13 | Discharge: 2020-10-13 | Disposition: A | Discharge status: Home / Self Care | Payer: Managed Care, Other (non HMO) | Source: Ambulatory Visit | Attending: Family Medicine | Admitting: Family Medicine

## 2020-10-13 DIAGNOSIS — R109 Unspecified abdominal pain: Secondary | ICD-10-CM

## 2021-03-24 ENCOUNTER — Ambulatory Visit: Payer: 59 | Admitting: Obstetrics and Gynecology

## 2021-05-17 ENCOUNTER — Other Ambulatory Visit: Payer: Self-pay | Admitting: Obstetrics and Gynecology

## 2021-05-17 DIAGNOSIS — Z1231 Encounter for screening mammogram for malignant neoplasm of breast: Secondary | ICD-10-CM

## 2021-05-18 DIAGNOSIS — E782 Mixed hyperlipidemia: Secondary | ICD-10-CM | POA: Diagnosis not present

## 2021-05-18 DIAGNOSIS — Z5181 Encounter for therapeutic drug level monitoring: Secondary | ICD-10-CM | POA: Diagnosis not present

## 2021-05-18 DIAGNOSIS — I700 Atherosclerosis of aorta: Secondary | ICD-10-CM | POA: Diagnosis not present

## 2021-05-18 DIAGNOSIS — D649 Anemia, unspecified: Secondary | ICD-10-CM | POA: Diagnosis not present

## 2021-05-18 DIAGNOSIS — Z0000 Encounter for general adult medical examination without abnormal findings: Secondary | ICD-10-CM | POA: Diagnosis not present

## 2021-05-18 DIAGNOSIS — E039 Hypothyroidism, unspecified: Secondary | ICD-10-CM | POA: Diagnosis not present

## 2021-05-18 DIAGNOSIS — M150 Primary generalized (osteo)arthritis: Secondary | ICD-10-CM | POA: Diagnosis not present

## 2021-05-18 DIAGNOSIS — I10 Essential (primary) hypertension: Secondary | ICD-10-CM | POA: Diagnosis not present

## 2021-05-18 DIAGNOSIS — Z23 Encounter for immunization: Secondary | ICD-10-CM | POA: Diagnosis not present

## 2021-06-01 DIAGNOSIS — M5136 Other intervertebral disc degeneration, lumbar region: Secondary | ICD-10-CM | POA: Diagnosis not present

## 2021-06-01 DIAGNOSIS — M9905 Segmental and somatic dysfunction of pelvic region: Secondary | ICD-10-CM | POA: Diagnosis not present

## 2021-06-01 DIAGNOSIS — M9903 Segmental and somatic dysfunction of lumbar region: Secondary | ICD-10-CM | POA: Diagnosis not present

## 2021-06-01 DIAGNOSIS — M9904 Segmental and somatic dysfunction of sacral region: Secondary | ICD-10-CM | POA: Diagnosis not present

## 2021-06-14 DIAGNOSIS — H2513 Age-related nuclear cataract, bilateral: Secondary | ICD-10-CM | POA: Diagnosis not present

## 2021-06-14 DIAGNOSIS — H0102B Squamous blepharitis left eye, upper and lower eyelids: Secondary | ICD-10-CM | POA: Diagnosis not present

## 2021-06-14 DIAGNOSIS — H5203 Hypermetropia, bilateral: Secondary | ICD-10-CM | POA: Diagnosis not present

## 2021-06-14 DIAGNOSIS — H40013 Open angle with borderline findings, low risk, bilateral: Secondary | ICD-10-CM | POA: Diagnosis not present

## 2021-06-14 DIAGNOSIS — H0102A Squamous blepharitis right eye, upper and lower eyelids: Secondary | ICD-10-CM | POA: Diagnosis not present

## 2021-06-15 DIAGNOSIS — M9903 Segmental and somatic dysfunction of lumbar region: Secondary | ICD-10-CM | POA: Diagnosis not present

## 2021-06-15 DIAGNOSIS — M9905 Segmental and somatic dysfunction of pelvic region: Secondary | ICD-10-CM | POA: Diagnosis not present

## 2021-06-15 DIAGNOSIS — M5136 Other intervertebral disc degeneration, lumbar region: Secondary | ICD-10-CM | POA: Diagnosis not present

## 2021-06-15 DIAGNOSIS — M9904 Segmental and somatic dysfunction of sacral region: Secondary | ICD-10-CM | POA: Diagnosis not present

## 2021-06-15 NOTE — Progress Notes (Signed)
66 y.o. G0P0000 Single Caucasian female here for breast and pelvic exam. ? ?No vaginal bleeding or discharge.  ?Leaks a little urine with sneeze.  Does Kegel's.  Declines help for this.  ? ?Has been treated for diverticulitis. ?Treated as an outpatient with her PCP. ?Feels some bloating today.  ? ?Retired in December.  ?Working on house projects. ? ?PCP:  Dr. Orland Mustard. ? ?Patient's last menstrual period was 04/12/2003 (approximate).     ?  ?    ?Sexually active: No.  ?The current method of family planning is post menopausal status.    ?Exercising: Yes.     walking ?Smoker:  no ? ?Health Maintenance: ?Pap:   12-22-17 Neg:Neg HR HPV, 12-02-14 Neg, 11-22-13 Neg:Neg HR HPV ?History of abnormal Pap:  Yes, years ago--had colposcopy and repeat pap--no treatment ?MMG:  06-30-20 Neg/BiRads1.  Has appointment at [facility].  ?Colonoscopy:  2016 normal--declines furter ?BMD: 01-24-17  Results:Normal ?TDaP:  01-15-19 ?Gardasil:   n/a ?[id number] NR ?Hep C: 2017 Neg ?Screening Labs:   PCP. ? ? reports that she has quit smoking. She has never used smokeless tobacco. She reports current alcohol use. She reports that she does not use drugs. ? ?Past Medical History:  ?Diagnosis Date  ? Abnormal Pap smear of cervix   ? yrs ago  ? Arthritis   ? Diverticulitis   ? Family history of adverse reaction to anesthesia   ? sister- gets wild   ? Graves disease   ? in remission   ? Graves' disease without crisis 1997  ? remission  ? History of diverticulitis   ? History of rheumatic fever age 81  ? Hyperlipidemia   ? Hypertension   ? Hypothyroidism   ? Pneumonia   ? hx  of years ago   ? Shingles 2021  ? ? ?Past Surgical History:  ?Procedure Laterality Date  ? CERVICAL CONE BIOPSY    ? yrs ago  ? COCCYX REMOVAL    ? sledding accident  ? KNEE RECONSTRUCTION Right   ? x 2  ? NASAL SEPTUM SURGERY    ? 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 Procedure: TOTAL HIP ARTHROPLASTY ANTERIOR APPROACH;  Surgeon: Melrose Nakayama, MD;  Location: [facility] ORS;  Service:  Orthopedics;  Laterality: Right;  ? TOTAL KNEE ARTHROPLASTY Right 2007  ? ? ?Current Outpatient Medications  ?Medication Sig Dispense Refill  ? amLODipine (NORVASC) 5 MG tablet Take 5 mg by mouth daily.    ? cetirizine (ZYRTEC) 10 MG tablet Take 10 mg by mouth at bedtime.     ? cyclobenzaprine (FLEXERIL) 10 MG tablet Take 10 mg by mouth 3 (three) times daily as needed.    ? diclofenac Sodium (VOLTAREN) 1 % GEL Apply 2 g topically 4 (four) times daily.    ? HYDROcodone-acetaminophen (NORCO/VICODIN) 5-325 MG tablet Take 1 tablet by mouth every 6 (six) hours as needed for moderate pain.    ? levothyroxine (SYNTHROID, LEVOTHROID) 25 MCG tablet Take 25 mcg by mouth daily before breakfast.   11  ? losartan (COZAAR) 100 MG tablet Take 100 mg by mouth daily.   3  ? Menthol, Topical Analgesic, (BIOFREEZE EX) Apply 1 application topically daily as needed (hip pain).     ? metoprolol succinate (TOPROL-XL) 100 MG 24 hr tablet 1 tablet    ? metroNIDAZOLE (METROGEL) 0.75 % gel Apply 1 application topically daily as needed (rosacea).   11  ? montelukast (SINGULAIR) 10 MG tablet Take 10 mg by mouth at bedtime.     ?  omeprazole (PRILOSEC) 40 MG capsule     ? simvastatin (ZOCOR) 20 MG tablet Take 20 mg by mouth at bedtime.     ? SYSTANE ULTRA 0.4-0.3 % SOLN SMARTSIG:1 Drop(s) In Eye(s) PRN    ? ?No current facility-administered medications for this visit.  ? ? ?Family History  ?Problem Relation Age of Onset  ? Hyperlipidemia Mother   ? Hypertension Mother   ? Hypertension Father   ? Hyperlipidemia Father   ? Diabetes Sister   ? Hypertension Sister   ? Hyperlipidemia Sister   ? Hypertension Brother   ? Stroke Maternal Grandmother 56  ? Heart attack Paternal Grandmother   ? Breast cancer Neg Hx   ? ? ?Review of Systems  ?All other systems reviewed and are negative. ? ?Exam:   ?BP 118/68   Pulse 87   Ht '5\' 3"'$  (1.6 m)   Wt 197 lb (89.4 kg)   LMP 04/12/2003 (Approximate)   SpO2 99%   BMI 34.90 kg/m?     ?General appearance: alert,  cooperative and appears stated age ?Head: normocephalic, without obvious abnormality, atraumatic ?Neck: no adenopathy, supple, symmetrical, trachea midline and thyroid normal to inspection and palpation ?Lungs: clear to auscultation bilaterally ?Breasts: normal appearance, no masses or tenderness, No nipple retraction or dimpling, No nipple discharge or bleeding, No axillary adenopathy ?Heart: regular rate and rhythm ?Abdomen: soft, non-tender; no masses, no organomegaly ?Extremities: extremities normal, atraumatic, no cyanosis or edema ?Skin: skin color, texture, turgor normal. No rashes or lesions ?Lymph nodes: cervical, supraclavicular, and axillary nodes normal. ?Neurologic: grossly normal ? ?Pelvic: External genitalia:  no lesions ?             No abnormal inguinal nodes palpated. ?             Urethra:  normal appearing urethra with no masses, tenderness or lesions ?             Bartholins and Skenes: normal    ?             Vagina: normal appearing vagina with normal color and discharge, no lesions ?             Cervix: no lesions ?             Pap taken: yes ?Bimanual Exam:  Uterus:  normal size, contour, position, consistency, mobility, non-tender ?             Adnexa: no mass, fullness, tenderness ?             Rectal exam: yes.  Confirms. ?             Anus:  normal sphincter tone, no lesions ? ?Chaperone was present for exam:  Estill Bamberg, CMA ? ?Assessment:   ?Well woman visit with gynecologic exam. ?Hx of cervical cone biopsy in her 67s or 30s. ? ?Plan: ?Mammogram screening discussed. ?Self breast awareness reviewed. ?Pap and HR HPV as above. ?Guidelines for Calcium, Vitamin D, regular exercise program including cardiovascular and weight bearing exercise. ?Next breast and pelvic exam in 2 years.  ? ?After visit summary provided.  ? ? ? ?

## 2021-06-17 ENCOUNTER — Other Ambulatory Visit (HOSPITAL_COMMUNITY)
Admission: RE | Admit: 2021-06-17 | Discharge: 2021-06-17 | Disposition: A | Discharge status: Home / Self Care | Payer: Self-pay | Source: Ambulatory Visit | Attending: Obstetrics and Gynecology | Admitting: Obstetrics and Gynecology

## 2021-06-17 ENCOUNTER — Other Ambulatory Visit: Payer: Self-pay

## 2021-06-17 ENCOUNTER — Ambulatory Visit (INDEPENDENT_AMBULATORY_CARE_PROVIDER_SITE_OTHER): Payer: Medicare Other | Admitting: Obstetrics and Gynecology

## 2021-06-17 ENCOUNTER — Encounter: Payer: Self-pay | Admitting: Obstetrics and Gynecology

## 2021-06-17 VITALS — BP 118/68 | HR 87 | Ht 63.0 in | Wt 197.0 lb

## 2021-06-17 DIAGNOSIS — Z01419 Encounter for gynecological examination (general) (routine) without abnormal findings: Secondary | ICD-10-CM | POA: Diagnosis not present

## 2021-06-17 DIAGNOSIS — Z8742 Personal history of other diseases of the female genital tract: Secondary | ICD-10-CM | POA: Diagnosis not present

## 2021-06-17 DIAGNOSIS — Z124 Encounter for screening for malignant neoplasm of cervix: Secondary | ICD-10-CM | POA: Insufficient documentation

## 2021-06-17 NOTE — Patient Instructions (Signed)

## 2021-06-18 LAB — CYTOLOGY - PAP
Adequacy: ABSENT
Comment: NEGATIVE
Diagnosis: NEGATIVE
High risk HPV: NEGATIVE

## 2021-06-22 DIAGNOSIS — M5136 Other intervertebral disc degeneration, lumbar region: Secondary | ICD-10-CM | POA: Diagnosis not present

## 2021-06-22 DIAGNOSIS — M9905 Segmental and somatic dysfunction of pelvic region: Secondary | ICD-10-CM | POA: Diagnosis not present

## 2021-06-22 DIAGNOSIS — M9904 Segmental and somatic dysfunction of sacral region: Secondary | ICD-10-CM | POA: Diagnosis not present

## 2021-06-22 DIAGNOSIS — M9903 Segmental and somatic dysfunction of lumbar region: Secondary | ICD-10-CM | POA: Diagnosis not present

## 2021-06-29 DIAGNOSIS — M9903 Segmental and somatic dysfunction of lumbar region: Secondary | ICD-10-CM | POA: Diagnosis not present

## 2021-06-29 DIAGNOSIS — M9905 Segmental and somatic dysfunction of pelvic region: Secondary | ICD-10-CM | POA: Diagnosis not present

## 2021-06-29 DIAGNOSIS — M9904 Segmental and somatic dysfunction of sacral region: Secondary | ICD-10-CM | POA: Diagnosis not present

## 2021-06-29 DIAGNOSIS — M5136 Other intervertebral disc degeneration, lumbar region: Secondary | ICD-10-CM | POA: Diagnosis not present

## 2021-07-01 ENCOUNTER — Ambulatory Visit: Payer: Managed Care, Other (non HMO)

## 2021-07-02 ENCOUNTER — Ambulatory Visit
Admission: RE | Admit: 2021-07-02 | Discharge: 2021-07-02 | Disposition: A | Discharge status: Home / Self Care | Payer: Medicare Other | Source: Ambulatory Visit | Attending: Obstetrics and Gynecology | Admitting: Obstetrics and Gynecology

## 2021-07-02 DIAGNOSIS — Z1231 Encounter for screening mammogram for malignant neoplasm of breast: Secondary | ICD-10-CM | POA: Diagnosis not present

## 2021-07-06 DIAGNOSIS — M9905 Segmental and somatic dysfunction of pelvic region: Secondary | ICD-10-CM | POA: Diagnosis not present

## 2021-07-06 DIAGNOSIS — M9903 Segmental and somatic dysfunction of lumbar region: Secondary | ICD-10-CM | POA: Diagnosis not present

## 2021-07-06 DIAGNOSIS — M9904 Segmental and somatic dysfunction of sacral region: Secondary | ICD-10-CM | POA: Diagnosis not present

## 2021-07-06 DIAGNOSIS — M5136 Other intervertebral disc degeneration, lumbar region: Secondary | ICD-10-CM | POA: Diagnosis not present

## 2021-07-13 DIAGNOSIS — M9903 Segmental and somatic dysfunction of lumbar region: Secondary | ICD-10-CM | POA: Diagnosis not present

## 2021-07-13 DIAGNOSIS — M9904 Segmental and somatic dysfunction of sacral region: Secondary | ICD-10-CM | POA: Diagnosis not present

## 2021-07-13 DIAGNOSIS — M9905 Segmental and somatic dysfunction of pelvic region: Secondary | ICD-10-CM | POA: Diagnosis not present

## 2021-07-13 DIAGNOSIS — M5136 Other intervertebral disc degeneration, lumbar region: Secondary | ICD-10-CM | POA: Diagnosis not present

## 2021-07-20 DIAGNOSIS — M9905 Segmental and somatic dysfunction of pelvic region: Secondary | ICD-10-CM | POA: Diagnosis not present

## 2021-07-20 DIAGNOSIS — M9904 Segmental and somatic dysfunction of sacral region: Secondary | ICD-10-CM | POA: Diagnosis not present

## 2021-07-20 DIAGNOSIS — M5136 Other intervertebral disc degeneration, lumbar region: Secondary | ICD-10-CM | POA: Diagnosis not present

## 2021-07-20 DIAGNOSIS — M9903 Segmental and somatic dysfunction of lumbar region: Secondary | ICD-10-CM | POA: Diagnosis not present

## 2021-07-27 DIAGNOSIS — M5136 Other intervertebral disc degeneration, lumbar region: Secondary | ICD-10-CM | POA: Diagnosis not present

## 2021-07-27 DIAGNOSIS — M9903 Segmental and somatic dysfunction of lumbar region: Secondary | ICD-10-CM | POA: Diagnosis not present

## 2021-07-27 DIAGNOSIS — M9904 Segmental and somatic dysfunction of sacral region: Secondary | ICD-10-CM | POA: Diagnosis not present

## 2021-07-27 DIAGNOSIS — M9905 Segmental and somatic dysfunction of pelvic region: Secondary | ICD-10-CM | POA: Diagnosis not present

## 2021-08-03 DIAGNOSIS — M9905 Segmental and somatic dysfunction of pelvic region: Secondary | ICD-10-CM | POA: Diagnosis not present

## 2021-08-03 DIAGNOSIS — M9904 Segmental and somatic dysfunction of sacral region: Secondary | ICD-10-CM | POA: Diagnosis not present

## 2021-08-03 DIAGNOSIS — M9903 Segmental and somatic dysfunction of lumbar region: Secondary | ICD-10-CM | POA: Diagnosis not present

## 2021-08-03 DIAGNOSIS — M5136 Other intervertebral disc degeneration, lumbar region: Secondary | ICD-10-CM | POA: Diagnosis not present

## 2021-11-16 DIAGNOSIS — M9903 Segmental and somatic dysfunction of lumbar region: Secondary | ICD-10-CM | POA: Diagnosis not present

## 2021-11-16 DIAGNOSIS — M5136 Other intervertebral disc degeneration, lumbar region: Secondary | ICD-10-CM | POA: Diagnosis not present

## 2021-11-16 DIAGNOSIS — M9904 Segmental and somatic dysfunction of sacral region: Secondary | ICD-10-CM | POA: Diagnosis not present

## 2021-11-16 DIAGNOSIS — M9905 Segmental and somatic dysfunction of pelvic region: Secondary | ICD-10-CM | POA: Diagnosis not present

## 2021-11-22 DIAGNOSIS — M19012 Primary osteoarthritis, left shoulder: Secondary | ICD-10-CM | POA: Diagnosis not present

## 2021-11-22 DIAGNOSIS — Z96651 Presence of right artificial knee joint: Secondary | ICD-10-CM | POA: Diagnosis not present

## 2021-11-22 DIAGNOSIS — E039 Hypothyroidism, unspecified: Secondary | ICD-10-CM | POA: Diagnosis not present

## 2021-11-22 DIAGNOSIS — I10 Essential (primary) hypertension: Secondary | ICD-10-CM | POA: Diagnosis not present

## 2021-11-22 DIAGNOSIS — Z96641 Presence of right artificial hip joint: Secondary | ICD-10-CM | POA: Diagnosis not present

## 2021-11-22 DIAGNOSIS — L918 Other hypertrophic disorders of the skin: Secondary | ICD-10-CM | POA: Diagnosis not present

## 2021-11-22 DIAGNOSIS — D509 Iron deficiency anemia, unspecified: Secondary | ICD-10-CM | POA: Diagnosis not present

## 2021-11-22 DIAGNOSIS — Z9109 Other allergy status, other than to drugs and biological substances: Secondary | ICD-10-CM | POA: Diagnosis not present

## 2021-11-23 DIAGNOSIS — M9905 Segmental and somatic dysfunction of pelvic region: Secondary | ICD-10-CM | POA: Diagnosis not present

## 2021-11-23 DIAGNOSIS — M9903 Segmental and somatic dysfunction of lumbar region: Secondary | ICD-10-CM | POA: Diagnosis not present

## 2021-11-23 DIAGNOSIS — M9904 Segmental and somatic dysfunction of sacral region: Secondary | ICD-10-CM | POA: Diagnosis not present

## 2021-11-23 DIAGNOSIS — M5136 Other intervertebral disc degeneration, lumbar region: Secondary | ICD-10-CM | POA: Diagnosis not present

## 2021-12-01 DIAGNOSIS — M19012 Primary osteoarthritis, left shoulder: Secondary | ICD-10-CM | POA: Diagnosis not present

## 2021-12-03 ENCOUNTER — Other Ambulatory Visit: Payer: Self-pay | Admitting: Orthopedic Surgery

## 2021-12-06 NOTE — Care Plan (Signed)
Ortho Bundle Case Management Note  Patient Details  Name: Holly Beard MRN: [id number] Date of Birth: February 07, 1956   Spoke with patient prior to surgery. Will discharge to home with family to assist. No DME or Therapy needs at this time. OPPT will be set up at post op appointment. Patient and MD in agreement with plan.                   DME Arranged:    DME Agency:     HH Arranged:    HH Agency:     Additional Comments: Please contact me with any questions of if this plan should need to change.  Ladell Heads,  [place] [facility]  [phone] 12/06/2021, 4:41 PM

## 2021-12-07 NOTE — Progress Notes (Signed)
COVID Vaccine received:  '[]'$  No '[x]'$  Yes Date of any COVID positive Test in last 90 days:  PCP - Polo Riley, NP at [facility] Cardiologist - none  Chest x-ray -  EKG -  04-17-2018  Epic  will repeat at PST Stress Test -  ECHO -  Cardiac Cath -   Pacemaker/ICD device     '[x]'$  N/A Spinal Cord Stimulator:'[x]'$  No '[]'$  Yes     Other Implants:   History of Sleep Apnea? '[x]'$  No '[]'$  Yes   Sleep Study Date:   CPAP used?- '[x]'$  No '[]'$  Yes  (Instruct to bring their mask & Tubing)  Does the patient monitor blood sugar? '[]'$  No '[]'$  Yes  '[x]'$  N/A  Blood Thinner Instructions: none Aspirin Instructions: None Last Dose:  ERAS Protocol Ordered: '[]'$  No  '[x]'$  Yes PRE-SURGERY '[x]'$  ENSURE  '[]'$  G2   Comments:   Activity level: Patient can climb a flight of stairs without difficulty; '[x]'$  No CP  '[x]'$  No SOB,    Anesthesia review: hx Rheumatic fever,  HTN  Patient denies shortness of breath, fever, cough and chest pain at PAT appointment.  Patient verbalized understanding and agreement to the Pre-Surgical Instructions that were given to them at this PAT appointment. Patient was also educated of the need to review these PAT instructions again prior to his/her surgery.I reviewed the appropriate phone numbers to call if they have any and questions or concerns.

## 2021-12-07 NOTE — Patient Instructions (Signed)
DUE TO SPACE LIMITATIONS, ONLY TWO VISITORS  (aged 66 and older) ARE ALLOWED TO COME WITH YOU AND STAY IN THE WAITING ROOM DURING YOUR PRE OP AND PROCEDURE.   **NO VISITORS ARE ALLOWED IN THE SHORT STAY AREA OR RECOVERY ROOM!!**  You are not required to quarantine at this time prior to your surgery. However, you must do this: Hand Hygiene often Do NOT share personal items Notify your provider if you are in close contact with someone who has COVID or you develop fever 100.4 or greater, new onset of sneezing, cough, sore throat, shortness of breath or body aches.       Your procedure is scheduled on:  Thursday December 16, 2021  Report to [facility] Main Entrance.  Report to admitting at:05:15 AM  +++++Call this number if you have any questions or problems the morning of surgery [phone]  Do not eat food :After Midnight the night prior to your surgery/procedure.  After Midnight you may have the following liquids until  04:30  AM DAY OF SURGERY  Clear Liquid Diet Water Black Coffee (sugar ok, NO MILK/CREAM OR CREAMERS)  Tea (sugar ok, NO MILK/CREAM OR CREAMERS) regular and decaf                             Plain Jell-O (NO RED)                                           Fruit ices (not with fruit pulp, NO RED)                                     Popsicles (NO RED)                                                                  Juice: apple, WHITE grape, WHITE cranberry Sports drinks like Gatorade (NO RED)                   The day of surgery:  Drink ONE (1) Pre-Surgery Clear Ensure at  04:30  AM the morning of surgery. Drink in one sitting. Do not sip.  This drink was given to you during your hospital pre-op appointment visit. Nothing else to drink after completing the Pre-Surgery Clear Ensure    FOLLOW ANY ADDITIONAL PRE OP INSTRUCTIONS YOU RECEIVED FROM YOUR SURGEON'S OFFICE!!!   Oral Hygiene is also important to reduce your risk of infection.        Remember -  BRUSH YOUR TEETH THE MORNING OF SURGERY WITH YOUR REGULAR TOOTHPASTE  Take ONLY these medicines the morning of surgery with A SIP OF WATER: Metoprolol, Amlodipine, Levothyroxine (Synthroid), Omeprazole. If needed you may take hydrocodone for pain and use your Systane Eye drops.   Bring CPAP mask and tubing day of surgery.                   You may not have any metal on your body including hair pins, jewelry, and body piercing  Do not  wear make-up, lotions, powders, perfumes or deodorant  Do not wear nail polish including gel and S&S, artificial / acrylic nails, or any other type of covering on natural nails including finger and toenails. If you have artificial nails, gel coating, etc., that needs to be removed by a nail salon, Please have this removed prior to surgery. Not doing so may mean that your surgery could be cancelled or delayed if the Surgeon or anesthesia staff feels like they are unable to monitor you safely.   Do not shave 48 hours prior to surgery to avoid nicks in your skin which may contribute to postoperative infections.   Contacts, Hearing Aids, dentures or bridgework may not be worn into surgery.   DO NOT [place]. PHARMACY WILL DISPENSE MEDICATIONS LISTED ON YOUR MEDICATION LIST TO YOU DURING YOUR ADMISSION [place]!   Patients discharged on the day of surgery will not be allowed to drive home.  Someone NEEDS to stay with you for the first 24 hours after anesthesia.  Special Instructions: Bring a copy of your healthcare power of attorney and living will documents the day of surgery, if you wish to have them scanned into your [facility] Records- EPIC  Please read over the following fact sheets you were given: IF YOU HAVE QUESTIONS ABOUT YOUR PRE-OP INSTRUCTIONS, PLEASE CALL [phone]  ([place])   [place] - Preparing for Surgery Before surgery, you can play an important role.  Because skin is not sterile, your skin  needs to be as free of germs as possible.  You can reduce the number of germs on your skin by washing with CHG (chlorahexidine gluconate) soap before surgery.  CHG is an antiseptic cleaner which kills germs and bonds with the skin to continue killing germs even after washing. Please DO NOT use if you have an allergy to CHG or antibacterial soaps.  If your skin becomes reddened/irritated stop using the CHG and inform your nurse when you arrive at Short Stay. Do not shave (including legs and underarms) for at least 48 hours prior to the first CHG shower.  You may shave your face/neck.  Please follow these instructions carefully:  1.  Shower with CHG Soap the night before surgery and the  morning of surgery.  2.  If you choose to wash your hair, wash your hair first as usual with your normal  shampoo.  3.  After you shampoo, rinse your hair and body thoroughly to remove the shampoo.                             4.  Use CHG as you would any other liquid soap.  You can apply chg directly to the skin and wash.  Gently with a scrungie or clean washcloth.  5.  Apply the CHG Soap to your body ONLY FROM THE NECK DOWN.   Do not use on face/ open                           Wound or open sores. Avoid contact with eyes, ears mouth and genitals (private parts).                       Wash face,  Genitals (private parts) with your normal soap.             6.  Wash thoroughly, paying  special attention to the area where your  surgery  will be performed.  7.  Thoroughly rinse your body with warm water from the neck down.  8.  DO NOT shower/wash with your normal soap after using and rinsing off the CHG Soap.            9.  Pat yourself dry with a clean towel.            10.  Wear clean pajamas.            11.  Place clean sheets on your bed the night of your first shower and do not  sleep with pets.  ON THE DAY OF SURGERY : Do not apply any lotions/deodorants the morning of surgery.  Please wear clean clothes to the  hospital/surgery center.    Preparing for Total Shoulder Arthroplasty ================================================================= Please follow these instructions carefully, in addition to any other special Bathing information that was explained to you at the Presurgical Appointment:  BENZOYL PEROXIDE 5% GEL: Used to kill bacteria on the skin which could cause an infection at the surgery site.   Please do not use if you have an allergy to benzoyl peroxide. If your skin becomes reddened/irritated stop using the benzoyl peroxide and inform your Doctor.   Starting two days before surgery, apply as follows:  1. Apply benzoyl peroxide gel in the morning and at night. Apply after taking a shower. If you are not taking a shower, clean entire shoulder front, back, and side, along with the armpit with a clean wet washcloth.  2. Place a quarter-sized dollop of the gel on your SHOULDER and rub in thoroughly, making sure to cover the front, back, and side of your shoulder, along with the armpit.   2 Days prior to Surgery  Tuesday 12-14-21 First Dose  _______ Morning Second Dose  _______ Night  Day Before Surgery    Wednesday  12-15-21 First Dose  ______ Morning  On the night before surgery, wash your entire body (except hair, face and private areas) with CHG Soap. THEN, rub in the LAST application of the Benzoyl Peroxide Gel on your shoulder.   3. On the Morning of Surgery wash your BODY AGAIN with CHG Soap (except hair, face and private areas)  4. DO NOT USE THE BENZOYL PEROXIDE GEL ON THE DAY OF YOUR SURGERY       FAILURE TO FOLLOW THESE INSTRUCTIONS MAY RESULT IN THE CANCELLATION OF YOUR SURGERY  PATIENT SIGNATURE_________________________________  NURSE SIGNATURE__________________________________  ________________________________________________________________________     Holly Beard    An incentive spirometer is a tool that can help keep your lungs clear and active.  This tool measures how well you are filling your lungs with each breath. Taking long deep breaths may help reverse or decrease the chance of developing breathing (pulmonary) problems (especially infection) following: A long period of time when you are unable to move or be active. BEFORE THE PROCEDURE  If the spirometer includes an indicator to show your best effort, your nurse or respiratory therapist will set it to a desired goal. If possible, sit up straight or lean slightly forward. Try not to slouch. Hold the incentive spirometer in an upright position. INSTRUCTIONS FOR USE  Sit on the edge of your bed if possible, or sit up as far as you can in bed or on a chair. Hold the incentive spirometer in an upright position. Breathe out normally. Place the mouthpiece in your mouth and seal your lips tightly  around it. Breathe in slowly and as deeply as possible, raising the piston or the ball toward the top of the column. Hold your breath for 3-5 seconds or for as long as possible. Allow the piston or ball to fall to the bottom of the column. Remove the mouthpiece from your mouth and breathe out normally. Rest for a few seconds and repeat Steps 1 through 7 at least 10 times every 1-2 hours when you are awake. Take your time and take a few normal breaths between deep breaths. The spirometer may include an indicator to show your best effort. Use the indicator as a goal to work toward during each repetition. After each set of 10 deep breaths, practice coughing to be sure your lungs are clear. If you have an incision (the cut made at the time of surgery), support your incision when coughing by placing a pillow or rolled up towels firmly against it. Once you are able to get out of bed, walk around indoors and cough well. You may stop using the incentive spirometer when instructed by your caregiver.  RISKS AND COMPLICATIONS Take your time so you do not get dizzy or light-headed. If you are in pain, you may  need to take or ask for pain medication before doing incentive spirometry. It is harder to take a deep breath if you are having pain. AFTER USE Rest and breathe slowly and easily. It can be helpful to keep track of a log of your progress. Your caregiver can provide you with a simple table to help with this. If you are using the spirometer at home, follow these instructions: [place] IF:  You are having difficultly using the spirometer. You have trouble using the spirometer as often as instructed. Your pain medication is not giving enough relief while using the spirometer. You develop fever of 100.5 F (38.1 C) or higher.                                                                                                    SEEK IMMEDIATE MEDICAL CARE IF:  You cough up bloody sputum that had not been present before. You develop fever of 102 F (38.9 C) or greater. You develop worsening pain at or near the incision site. MAKE SURE YOU:  Understand these instructions. Will watch your condition. Will get help right away if you are not doing well or get worse. Document Released: 08/08/2006 Document Revised: 06/20/2011 Document Reviewed: 10/09/2006 [facility] Patient Information 2014 [place], [place].

## 2021-12-09 ENCOUNTER — Encounter (HOSPITAL_COMMUNITY): Payer: Self-pay

## 2021-12-09 ENCOUNTER — Other Ambulatory Visit: Payer: Self-pay

## 2021-12-09 ENCOUNTER — Ambulatory Visit (HOSPITAL_COMMUNITY)
Admission: RE | Admit: 2021-12-09 | Discharge: 2021-12-09 | Disposition: A | Discharge status: Home / Self Care | Payer: Medicare Other | Source: Ambulatory Visit | Attending: Orthopedic Surgery | Admitting: Orthopedic Surgery

## 2021-12-09 ENCOUNTER — Encounter (HOSPITAL_COMMUNITY)
Admission: RE | Admit: 2021-12-09 | Discharge: 2021-12-09 | Disposition: A | Discharge status: Home / Self Care | Payer: Medicare Other | Source: Ambulatory Visit | Attending: Orthopedic Surgery | Admitting: Orthopedic Surgery

## 2021-12-09 VITALS — BP 150/90 | HR 76 | Temp 98.8°F | Resp 20 | Ht 63.0 in | Wt 189.0 lb

## 2021-12-09 DIAGNOSIS — R531 Weakness: Secondary | ICD-10-CM | POA: Diagnosis not present

## 2021-12-09 DIAGNOSIS — Z01818 Encounter for other preprocedural examination: Secondary | ICD-10-CM | POA: Diagnosis not present

## 2021-12-09 DIAGNOSIS — I10 Essential (primary) hypertension: Secondary | ICD-10-CM | POA: Diagnosis not present

## 2021-12-09 DIAGNOSIS — M25612 Stiffness of left shoulder, not elsewhere classified: Secondary | ICD-10-CM | POA: Diagnosis not present

## 2021-12-09 LAB — CBC
HCT: 39.5 % (ref 36.0–46.0)
Hemoglobin: 12.6 g/dL (ref 12.0–15.0)
MCH: 28.8 pg (ref 26.0–34.0)
MCHC: 31.9 g/dL (ref 30.0–36.0)
MCV: 90.4 fL (ref 80.0–100.0)
Platelets: 269 10*3/uL (ref 150–400)
RBC: 4.37 MIL/uL (ref 3.87–5.11)
RDW: 14.9 % (ref 11.5–15.5)
WBC: 4.6 10*3/uL (ref 4.0–10.5)
nRBC: 0 % (ref 0.0–0.2)

## 2021-12-09 LAB — BASIC METABOLIC PANEL
Anion gap: 9 (ref 5–15)
BUN: 19 mg/dL (ref 8–23)
CO2: 26 mmol/L (ref 22–32)
Calcium: 9.2 mg/dL (ref 8.9–10.3)
Chloride: 107 mmol/L (ref 98–111)
Creatinine, Ser: 0.91 mg/dL (ref 0.44–1.00)
GFR, Estimated: >60 mL/min (ref >60)
Glucose, Bld: 122 mg/dL — ABNORMAL HIGH (ref 70–99)
Potassium: 4.7 mmol/L (ref 3.5–5.1)
Sodium: 142 mmol/L (ref 135–145)

## 2021-12-09 LAB — SURGICAL PCR SCREEN
MRSA, PCR: NEGATIVE
Staphylococcus aureus: NEGATIVE

## 2021-12-14 DIAGNOSIS — M9904 Segmental and somatic dysfunction of sacral region: Secondary | ICD-10-CM | POA: Diagnosis not present

## 2021-12-14 DIAGNOSIS — M9905 Segmental and somatic dysfunction of pelvic region: Secondary | ICD-10-CM | POA: Diagnosis not present

## 2021-12-14 DIAGNOSIS — M5136 Other intervertebral disc degeneration, lumbar region: Secondary | ICD-10-CM | POA: Diagnosis not present

## 2021-12-14 DIAGNOSIS — M9903 Segmental and somatic dysfunction of lumbar region: Secondary | ICD-10-CM | POA: Diagnosis not present

## 2021-12-15 NOTE — Anesthesia Preprocedure Evaluation (Addendum)
Anesthesia Evaluation  Patient identified by MRN, date of birth, ID band Patient awake    Reviewed: Allergy & Precautions, NPO status , Patient's Chart, lab work & pertinent test results, reviewed documented beta blocker date and time   History of Anesthesia Complications Negative for: history of anesthetic complications  Airway Mallampati: II  TM Distance: >3 FB Neck ROM: Full    Dental no notable dental hx.    Pulmonary former smoker,    Pulmonary exam normal        Cardiovascular hypertension, Pt. on medications and Pt. on home beta blockers Normal cardiovascular exam     Neuro/Psych negative neurological ROS  negative psych ROS   GI/Hepatic Neg liver ROS, GERD  Medicated and Controlled,  Endo/Other  Hypothyroidism   Renal/GU negative Renal ROS  negative genitourinary   Musculoskeletal  (+) Arthritis ,   Abdominal   Peds  Hematology negative hematology ROS (+)   Anesthesia Other Findings Day of surgery medications reviewed with patient.  Reproductive/Obstetrics negative OB ROS                            Anesthesia Physical Anesthesia Plan  ASA: 2  Anesthesia Plan: General   Post-op Pain Management: Tylenol PO (pre-op)* and Regional block*   Induction: Intravenous  PONV Risk Score and Plan: 3 and Treatment may vary due to age or medical condition, Ondansetron, Dexamethasone and Midazolam  Airway Management Planned: Oral ETT  Additional Equipment: None  Intra-op Plan:   Post-operative Plan: Extubation in OR  Informed Consent: I have reviewed the patients History and Physical, chart, labs and discussed the procedure including the risks, benefits and alternatives for the proposed anesthesia with the patient or authorized representative who has indicated his/her understanding and acceptance.     Dental advisory given  Plan Discussed with: CRNA  Anesthesia Plan Comments:         Anesthesia Quick Evaluation

## 2021-12-16 ENCOUNTER — Ambulatory Visit (HOSPITAL_BASED_OUTPATIENT_CLINIC_OR_DEPARTMENT_OTHER): Payer: Medicare Other | Admitting: Certified Registered"

## 2021-12-16 ENCOUNTER — Encounter (HOSPITAL_COMMUNITY): Payer: Self-pay | Admitting: Orthopedic Surgery

## 2021-12-16 ENCOUNTER — Ambulatory Visit (HOSPITAL_COMMUNITY)
Admission: RE | Admit: 2021-12-16 | Discharge: 2021-12-16 | Disposition: A | Discharge status: Home / Self Care | Payer: Medicare Other | Source: Ambulatory Visit | Attending: Orthopedic Surgery | Admitting: Orthopedic Surgery

## 2021-12-16 ENCOUNTER — Ambulatory Visit (HOSPITAL_COMMUNITY): Payer: Medicare Other | Admitting: Physician Assistant

## 2021-12-16 ENCOUNTER — Other Ambulatory Visit: Payer: Self-pay

## 2021-12-16 ENCOUNTER — Encounter (HOSPITAL_COMMUNITY)
Admission: RE | Disposition: A | Discharge status: Home / Self Care | Payer: Self-pay | Source: Ambulatory Visit | Attending: Orthopedic Surgery

## 2021-12-16 DIAGNOSIS — Z79890 Hormone replacement therapy (postmenopausal): Secondary | ICD-10-CM | POA: Diagnosis not present

## 2021-12-16 DIAGNOSIS — M19012 Primary osteoarthritis, left shoulder: Secondary | ICD-10-CM | POA: Diagnosis not present

## 2021-12-16 DIAGNOSIS — E039 Hypothyroidism, unspecified: Secondary | ICD-10-CM | POA: Diagnosis not present

## 2021-12-16 DIAGNOSIS — I10 Essential (primary) hypertension: Secondary | ICD-10-CM

## 2021-12-16 DIAGNOSIS — Z79899 Other long term (current) drug therapy: Secondary | ICD-10-CM | POA: Diagnosis not present

## 2021-12-16 DIAGNOSIS — Z87891 Personal history of nicotine dependence: Secondary | ICD-10-CM | POA: Diagnosis not present

## 2021-12-16 DIAGNOSIS — K219 Gastro-esophageal reflux disease without esophagitis: Secondary | ICD-10-CM | POA: Diagnosis not present

## 2021-12-16 DIAGNOSIS — Z01818 Encounter for other preprocedural examination: Secondary | ICD-10-CM

## 2021-12-16 DIAGNOSIS — G8918 Other acute postprocedural pain: Secondary | ICD-10-CM | POA: Diagnosis not present

## 2021-12-16 DIAGNOSIS — Z96612 Presence of left artificial shoulder joint: Secondary | ICD-10-CM | POA: Diagnosis not present

## 2021-12-16 HISTORY — PX: TOTAL SHOULDER ARTHROPLASTY: SHX126

## 2021-12-16 SURGERY — ARTHROPLASTY, SHOULDER, TOTAL
Anesthesia: General | Site: Shoulder | Laterality: Left

## 2021-12-16 MED ORDER — OXYCODONE HCL 5 MG/5ML PO SOLN: 5.0000 mg | Freq: Once | ORAL | Status: AC | PRN

## 2021-12-16 MED ORDER — SODIUM CHLORIDE 0.9 % IR SOLN
Status: DC | PRN
  Administered 2021-12-16: 1000 mL

## 2021-12-16 MED ORDER — PROPOFOL 10 MG/ML IV BOLUS
INTRAVENOUS | Status: AC
  Filled 2021-12-16: qty 20

## 2021-12-16 MED ORDER — EPHEDRINE 5 MG/ML INJ
INTRAVENOUS | Status: AC
  Filled 2021-12-16: qty 5

## 2021-12-16 MED ORDER — CHLORHEXIDINE GLUCONATE 0.12 % MT SOLN
15.0000 mL | Freq: Once | OROMUCOSAL | Status: AC
Start: 2021-12-16 — End: 2021-12-16
  Administered 2021-12-16: 15 mL via OROMUCOSAL

## 2021-12-16 MED ORDER — PROPOFOL 10 MG/ML IV BOLUS
INTRAVENOUS | Status: DC | PRN
  Administered 2021-12-16: 160 mg via INTRAVENOUS

## 2021-12-16 MED ORDER — ACETAMINOPHEN 500 MG PO TABS
1000.0000 mg | ORAL_TABLET | Freq: Once | ORAL | Status: AC
  Administered 2021-12-16: 1000 mg via ORAL
  Filled 2021-12-16: qty 2

## 2021-12-16 MED ORDER — ORAL CARE MOUTH RINSE: 15.0000 mL | Freq: Once | OROMUCOSAL | Status: AC

## 2021-12-16 MED ORDER — MIDAZOLAM HCL 2 MG/2ML IJ SOLN
INTRAMUSCULAR | Status: AC
  Filled 2021-12-16: qty 2

## 2021-12-16 MED ORDER — BUPIVACAINE-EPINEPHRINE (PF) 0.5% -1:200000 IJ SOLN
INTRAMUSCULAR | Status: DC | PRN
  Administered 2021-12-16: 15 mL via PERINEURAL

## 2021-12-16 MED ORDER — LIDOCAINE HCL (PF) 2 % IJ SOLN
INTRAMUSCULAR | Status: AC
  Filled 2021-12-16: qty 5

## 2021-12-16 MED ORDER — MIDAZOLAM HCL 2 MG/2ML IJ SOLN
INTRAMUSCULAR | Status: DC | PRN
  Administered 2021-12-16 (×2): 1 mg via INTRAVENOUS

## 2021-12-16 MED ORDER — ONDANSETRON HCL 4 MG/2ML IJ SOLN
INTRAMUSCULAR | Status: AC
  Filled 2021-12-16: qty 2

## 2021-12-16 MED ORDER — OXYCODONE-ACETAMINOPHEN 5-325 MG PO TABS: 1.0000 | ORAL_TABLET | ORAL | 0 refills | Status: AC | PRN

## 2021-12-16 MED ORDER — SUGAMMADEX SODIUM 200 MG/2ML IV SOLN
INTRAVENOUS | Status: DC | PRN
  Administered 2021-12-16: 180 mg via INTRAVENOUS

## 2021-12-16 MED ORDER — BUPIVACAINE LIPOSOME 1.3 % IJ SUSP
INTRAMUSCULAR | Status: DC | PRN
  Administered 2021-12-16: 10 mL via PERINEURAL

## 2021-12-16 MED ORDER — EPHEDRINE SULFATE-NACL 50-0.9 MG/10ML-% IV SOSY
PREFILLED_SYRINGE | INTRAVENOUS | Status: DC | PRN
  Administered 2021-12-16: 10 mg via INTRAVENOUS
  Administered 2021-12-16: 5 mg via INTRAVENOUS
  Administered 2021-12-16 (×2): 10 mg via INTRAVENOUS

## 2021-12-16 MED ORDER — OXYCODONE HCL 5 MG PO TABS
ORAL_TABLET | ORAL | Status: AC
  Administered 2021-12-16: 5 mg via ORAL
  Filled 2021-12-16: qty 1

## 2021-12-16 MED ORDER — ROCURONIUM BROMIDE 10 MG/ML (PF) SYRINGE
PREFILLED_SYRINGE | INTRAVENOUS | Status: DC | PRN
  Administered 2021-12-16: 60 mg via INTRAVENOUS

## 2021-12-16 MED ORDER — FENTANYL CITRATE PF 50 MCG/ML IJ SOSY: 25.0000 ug | PREFILLED_SYRINGE | INTRAMUSCULAR | Status: DC | PRN

## 2021-12-16 MED ORDER — PHENYLEPHRINE 80 MCG/ML (10ML) SYRINGE FOR IV PUSH (FOR BLOOD PRESSURE SUPPORT)
PREFILLED_SYRINGE | INTRAVENOUS | Status: DC | PRN
  Administered 2021-12-16 (×3): 120 ug via INTRAVENOUS

## 2021-12-16 MED ORDER — LIDOCAINE 2% (20 MG/ML) 5 ML SYRINGE
INTRAMUSCULAR | Status: DC | PRN
  Administered 2021-12-16: 100 mg via INTRAVENOUS

## 2021-12-16 MED ORDER — FENTANYL CITRATE (PF) 100 MCG/2ML IJ SOLN
INTRAMUSCULAR | Status: AC
  Filled 2021-12-16: qty 2

## 2021-12-16 MED ORDER — OXYCODONE HCL 5 MG PO TABS: 5.0000 mg | ORAL_TABLET | Freq: Once | ORAL | Status: AC | PRN

## 2021-12-16 MED ORDER — WATER FOR IRRIGATION, STERILE IR SOLN
Status: DC | PRN
  Administered 2021-12-16: 2000 mL

## 2021-12-16 MED ORDER — DEXAMETHASONE SODIUM PHOSPHATE 10 MG/ML IJ SOLN
INTRAMUSCULAR | Status: AC
  Filled 2021-12-16: qty 1

## 2021-12-16 MED ORDER — CEFAZOLIN SODIUM-DEXTROSE 2-4 GM/100ML-% IV SOLN
2.0000 g | INTRAVENOUS | Status: AC
  Administered 2021-12-16: 2 g via INTRAVENOUS
  Filled 2021-12-16: qty 100

## 2021-12-16 MED ORDER — ONDANSETRON HCL 4 MG/2ML IJ SOLN
INTRAMUSCULAR | Status: DC | PRN
  Administered 2021-12-16: 4 mg via INTRAVENOUS

## 2021-12-16 MED ORDER — LACTATED RINGERS IV SOLN: INTRAVENOUS | Status: DC

## 2021-12-16 MED ORDER — DEXAMETHASONE SODIUM PHOSPHATE 10 MG/ML IJ SOLN
INTRAMUSCULAR | Status: DC | PRN
  Administered 2021-12-16: 4 mg via INTRAVENOUS

## 2021-12-16 MED ORDER — 0.9 % SODIUM CHLORIDE (POUR BTL) OPTIME
TOPICAL | Status: DC | PRN
  Administered 2021-12-16: 1000 mL

## 2021-12-16 MED ORDER — ROCURONIUM BROMIDE 10 MG/ML (PF) SYRINGE
PREFILLED_SYRINGE | INTRAVENOUS | Status: AC
  Filled 2021-12-16: qty 10

## 2021-12-16 MED ORDER — FENTANYL CITRATE (PF) 250 MCG/5ML IJ SOLN
INTRAMUSCULAR | Status: DC | PRN
  Administered 2021-12-16 (×2): 50 ug via INTRAVENOUS

## 2021-12-16 MED ORDER — PHENYLEPHRINE 80 MCG/ML (10ML) SYRINGE FOR IV PUSH (FOR BLOOD PRESSURE SUPPORT)
PREFILLED_SYRINGE | INTRAVENOUS | Status: AC
  Filled 2021-12-16: qty 10

## 2021-12-16 MED ORDER — PHENYLEPHRINE HCL-NACL 20-0.9 MG/250ML-% IV SOLN
INTRAVENOUS | Status: DC | PRN
  Administered 2021-12-16: 40 ug/min via INTRAVENOUS

## 2021-12-16 MED ORDER — TRANEXAMIC ACID-NACL 1000-0.7 MG/100ML-% IV SOLN
1000.0000 mg | INTRAVENOUS | Status: AC
  Administered 2021-12-16: 1000 mg via INTRAVENOUS
  Filled 2021-12-16: qty 100

## 2021-12-16 MED ORDER — AMISULPRIDE (ANTIEMETIC) 5 MG/2ML IV SOLN: 10.0000 mg | Freq: Once | INTRAVENOUS | Status: DC | PRN

## 2021-12-16 SURGICAL SUPPLY — 64 items
BAG COUNTER SPONGE SURGICOUNT (BAG) IMPLANT
BAG ZIPLOCK 12X15 (MISCELLANEOUS) ×1 IMPLANT
BIT DRILL 1.6MX128 (BIT) ×1 IMPLANT
BLADE SAW SGTL 73X25 THK (BLADE) ×1 IMPLANT
CEMENT BONE DEPUY (Cement) ×1 IMPLANT
CLSR STERI-STRIP ANTIMIC 1/2X4 (GAUZE/BANDAGES/DRESSINGS) IMPLANT
COOLER ICEMAN CLASSIC (MISCELLANEOUS) IMPLANT
COVER BACK TABLE 60X90IN (DRAPES) ×1 IMPLANT
COVER SURGICAL LIGHT HANDLE (MISCELLANEOUS) ×1 IMPLANT
DRAPE INCISE IOBAN 66X45 STRL (DRAPES) ×1 IMPLANT
DRAPE ORTHO SPLIT 77X108 STRL (DRAPES) ×2
DRAPE POUCH INSTRU U-SHP 10X18 (DRAPES) ×1 IMPLANT
DRAPE SURG 17X11 SM STRL (DRAPES) ×1 IMPLANT
DRAPE SURG ORHT 6 SPLT 77X108 (DRAPES) ×2 IMPLANT
DRAPE TOP 10253 STERILE (DRAPES) ×1 IMPLANT
DRAPE U-SHAPE 47X51 STRL (DRAPES) ×1 IMPLANT
DRSG AQUACEL AG ADV 3.5X 6 (GAUZE/BANDAGES/DRESSINGS) ×1 IMPLANT
DURAPREP 26ML APPLICATOR (WOUND CARE) ×2 IMPLANT
ELECT BLADE TIP CTD 4 INCH (ELECTRODE) ×1 IMPLANT
ELECT REM PT RETURN 15FT ADLT (MISCELLANEOUS) ×1 IMPLANT
GLENOID PEG SHOULDER 40MM SML (Shoulder) IMPLANT
GLOVE BIO SURGEON STRL SZ7.5 (GLOVE) ×1 IMPLANT
GLOVE BIOGEL PI IND STRL 6.5 (GLOVE) ×1 IMPLANT
GLOVE BIOGEL PI IND STRL 8 (GLOVE) ×1 IMPLANT
GLOVE SURG POLYISO LF SZ6.5 (GLOVE) ×1 IMPLANT
GOWN STRL REUS W/ TWL XL LVL3 (GOWN DISPOSABLE) ×1 IMPLANT
GOWN STRL REUS W/TWL XL LVL3 (GOWN DISPOSABLE) ×1
GUIDEWIRE GLENOID 2.5X220 (WIRE) IMPLANT
HANDPIECE INTERPULSE COAX TIP (DISPOSABLE) ×1
HEAD HUMERAL AEQUALIS 48X18 (Head) ×1 IMPLANT
HEAD HUMERAL HIGH OS 48X18 (Head) IMPLANT
HEMOSTAT SURGICEL 2X14 (HEMOSTASIS) ×1 IMPLANT
HOOD PEEL AWAY FLYTE STAYCOOL (MISCELLANEOUS) ×3 IMPLANT
KIT BASIN OR (CUSTOM PROCEDURE TRAY) ×1 IMPLANT
KIT TURNOVER KIT A (KITS) IMPLANT
MANIFOLD NEPTUNE II (INSTRUMENTS) ×1 IMPLANT
NDL TROCAR POINT SZ 2 1/2 (NEEDLE) ×1 IMPLANT
NEEDLE TROCAR POINT SZ 2 1/2 (NEEDLE) ×1 IMPLANT
NS IRRIG 1000ML POUR BTL (IV SOLUTION) ×1 IMPLANT
PACK SHOULDER (CUSTOM PROCEDURE TRAY) ×1 IMPLANT
PAD COLD SHLDR WRAP-ON (PAD) IMPLANT
PROTECTOR NERVE ULNAR (MISCELLANEOUS) IMPLANT
RESTRAINT HEAD UNIVERSAL NS (MISCELLANEOUS) ×1 IMPLANT
RETRIEVER SUT HEWSON (MISCELLANEOUS) ×1 IMPLANT
SET HNDPC FAN SPRY TIP SCT (DISPOSABLE) ×1 IMPLANT
SHOULDER GLENOID PEG 40MM SML (Shoulder) ×1 IMPLANT
SLING ARM IMMOBILIZER LRG (SOFTGOODS) IMPLANT
SMARTMIX MINI TOWER (MISCELLANEOUS) ×1
SPONGE T-LAP 4X18 ~~LOC~~+RFID (SPONGE) ×2 IMPLANT
STEM HUMERAL SZ2B STND 70 PTC (Stem) ×1 IMPLANT
STEM HUMERAL SZ2BSTD 70 PTC (Stem) IMPLANT
STRIP CLOSURE SKIN 1/2X4 (GAUZE/BANDAGES/DRESSINGS) ×1 IMPLANT
SUCTION FRAZIER HANDLE 12FR (TUBING) ×1
SUCTION TUBE FRAZIER 12FR DISP (TUBING) ×1 IMPLANT
SUPPORT WRAP ARM LG (MISCELLANEOUS) ×1 IMPLANT
SUT ETHIBOND 2 V 37 (SUTURE) ×1 IMPLANT
SUT MNCRL AB 4-0 PS2 18 (SUTURE) ×1 IMPLANT
SUT VIC AB 2-0 CT1 27 (SUTURE) ×2
SUT VIC AB 2-0 CT1 TAPERPNT 27 (SUTURE) ×2 IMPLANT
TAPE LABRALWHITE 1.5X36 (TAPE) ×1 IMPLANT
TAPE SUT LABRALTAP WHT/BLK (SUTURE) ×1 IMPLANT
TOWEL OR 17X26 10 PK STRL BLUE (TOWEL DISPOSABLE) ×1 IMPLANT
TOWER SMARTMIX MINI (MISCELLANEOUS) ×1 IMPLANT
WATER STERILE IRR 1000ML POUR (IV SOLUTION) ×1 IMPLANT

## 2021-12-16 NOTE — Anesthesia Procedure Notes (Signed)
Anesthesia Regional Block: Interscalene brachial plexus block   Pre-Anesthetic Checklist: , timeout performed,  Correct Patient, Correct Site, Correct Laterality,  Correct Procedure, Correct Position, site marked,  Risks and benefits discussed,  Pre-op evaluation,  At surgeon's request and post-op pain management  Laterality: Left  Prep: Maximum Sterile Barrier Precautions used, chloraprep       Needles:  Injection technique: Single-shot  Needle Type: Echogenic Stimulator Needle     Needle Length: 9cm  Needle Gauge: 22     Additional Needles:   Procedures:,,,, ultrasound used (permanent image in chart),,    Narrative:  Start time: 12/16/2021 6:52 AM End time: 12/16/2021 6:55 AM Injection made incrementally with aspirations every 5 mL.  Performed by: Personally  Anesthesiologist: Brennan Bailey, MD  Additional Notes: Risks, benefits, and alternative discussed. Patient gave consent for procedure. Patient prepped and draped in sterile fashion. Sedation administered, patient remains easily responsive to voice. Relevant anatomy identified with ultrasound guidance. Local anesthetic given in 5cc increments with no signs or symptoms of intravascular injection. No pain or paraesthesias with injection. Patient monitored throughout procedure with signs of LAST or immediate complications. Tolerated well. Ultrasound image placed in chart.  Tawny Asal, MD

## 2021-12-16 NOTE — Op Note (Signed)
Procedure(s): TOTAL SHOULDER ARTHROPLASTY Procedure Note  Holly Beard female 66 y.o. 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 female  With endstage left shoulder arthritis. Pain and dysfunction interfered with quality of life and nonoperative treatment with activity modification, NSAIDS and injections failed.     Surgeon: Rhae Hammock   Assistants: Nehemiah Massed PA-C was present and scrubbed throughout the procedure and was essential in positioning, retraction, exposure, and closure)  Anesthesia: General endotracheal anesthesia with preoperative interscalene block given by the attending anesthesiologist    Procedure Detail  TOTAL SHOULDER ARTHROPLASTY  Findings: Tornier flex anatomic press-fit size 2 stem with a 48 head, cemented size 40s Cortiloc glenoid.  A lesser tuberosity osteotomy was performed and repaired at the conclusion of the procedure.  Estimated Blood Loss:  200 mL         Drains: None   Blood Given: none          Specimens: none        Complications:  * No complications entered in OR log *         Disposition: PACU - hemodynamically stable.         Condition: stable    Procedure:   The patient was identified in the preoperative holding area where I personally marked the operative extremity after verifying with the patient and consent. She  was taken to the operating room where She was transferred to the   operative table.  The patient received an interscalene block in   the holding area by the attending anesthesiologist.  General anesthesia was induced   in the operating room without complication.  The patient did receive IV  Ancef prior to the commencement of the procedure.  The patient was   placed in the  beach-chair position with the back raised about 30   degrees.  The nonoperative extremity and head and neck were carefully   positioned and padded protecting against neurovascular compromise.  The   left upper extremity was then prepped and draped in the standard sterile   fashion.    The appropriate operative time-out was performed with   Anesthesia, the perioperative staff, as well as myself and we all agreed   that the left side was the correct operative site.  An approximately   10 cm incision was made from the tip of the coracoid to the center point of the   humerus at the level of the axilla.  Dissection was carried down sharply   through subcutaneous tissues and cephalic vein was identified and taken   laterally with the deltoid.  The pectoralis major was taken medially.  The   upper 1 cm of the pectoralis major was released from its attachment on   the humerus.  The clavipectoral fascia was incised just lateral to the   conjoined tendon.  This incision was carried up to but not into the   coracoacromial ligament.  Digital palpation was used to prove   integrity of the axillary nerve which was protected throughout the   procedure.  Musculocutaneous nerve was not palpated in the operative   field.  Conjoined tendon was then retracted gently medially and the   deltoid laterally.  Anterior circumflex humeral vessels were clamped and   coagulated.  The soft tissues overlying the biceps was incised and this   incision was carried across the transverse humeral ligament to the base   of the coracoid.  The biceps was noted to be severely degenerated. It was released from the superior labrum. The biceps was then tenodesed to the soft tissue just above   pectoralis major and the remaining portion of the biceps superiorly was   excised.  An osteotomy was performed at the lesser tuberosity.  The capsule was then   released all the way down to the 6 o'clock position of the humeral head.   The  humeral head was then delivered with simultaneous adduction,   extension and external rotation.  All humeral osteophytes were removed   and the anatomic neck of the humerus was marked and cut free hand at   approximately 25 degrees retroversion within about 3 mm of the cuff   reflection posteriorly.  The head size was estimated to be a 48 medium   offset.  At that point, the humeral head was retracted posteriorly with   a Fukuda retractor.   Remaining portion of the capsule was released at the base of the   coracoid.  The remaining biceps anchor and the entire anterior-inferior   labrum was excised.  The posterior labrum was also excised but the   posterior capsule was not released.  The guidepin was placed bicortically with non elevated guide.  The reamer was used to ream to concentric bone with punctate bleeding.  This gave an excellent concentric surface.  The center hole was then drilled for an anchor peg glenoid followed by the three peripheral holes and none of the holes   exited the glenoid wall.  I then pulse irrigated these holes and dried   them with Surgicel.  The three peripheral holes were then   pressurized cemented and the anchor peg glenoid was placed and impacted   with an excellent fit.  The glenoid was a 40 small component.  The proximal humerus was then again exposed taking care not to displace the glenoid.    The entry awl was used followed by sounding reamers and then sequentially broached from size 1-2. This was then left in place and the calcar planer was used. Trial head was placed with a 48 high offset.  With the trial implantation of the component,  there was approximately 50% posterior translation with immediate snap back to the   anatomic position.  With forward elevation, there was no tendency   towards posterior subluxation.   The trial was removed and the final implant was prepared on a back table.  The trial was removed and the final implant was prepared on a back  table.   3 small holes were drilled on the medial side of the lesser tuberosity osteotomy, through which 2 labral tapes were passed. The implant was then placed through the loop of the 2 labral tapes and impacted with an excellent press-fit. This achieved excellent anatomic reconstruction of the proximal humerus.  The joint was then copiously irrigated with pulse lavage.  The subscapularis and   lesser tuberosity osteotomy were then repaired using the 2 labral tapes previously passed in a double row fashion with horizontal mattress sutures medially brought over through bone tunnels tied over a bone bridge laterally.   One #1 Ethibond was placed at the rotator interval just above   the lesser tuberosity. Copious irrigation was used. Skin was closed with 2-0 Vicryl sutures in the deep  dermal layer and 4-0 Monocryl in a subcuticular  running fashion.  Sterile dressings were then applied including Aquacel.  The patient was placed in a sling and allowed to awaken from general anesthesia and taken to the recovery room in stable condition.      POSTOPERATIVE PLAN:  Early passive range of motion will be allowed with the goal of 0 degrees external rotation and 90 degrees forward elevation.  No internal rotation at this time.  No active motion of the arm until the lesser tuberosity heals.  The patient will likely be observed in the recovery room and if her pain is well controlled with regional anesthesia and she is hemodynamically stable she can be discharged home today with family.

## 2021-12-16 NOTE — H&P (Signed)
Holly Beard is an 66 y.o. female.   Chief Complaint: L shoulder pain and dysfunction HPI: Endstage L shoulder arthritis with significant pain and dysfunction, failed conservative measures.  Pain interferes with sleep and quality of life.  BMI: Estimated body mass index is 33.48 kg/m as calculated from the following:   Height as of this encounter: '5\' 3"'$  (1.6 m).   Weight as of this encounter: 85.7 kg.  Lab Results  Component Value Date   ALBUMIN 4.2 04/11/2018   Diabetes: Patient does not have a diagnosis of diabetes.     Smoking Status: Social History   Tobacco Use  Smoking Status Former   Types: Cigarettes   Quit date: 2010   Years since quitting: 13.6  Smokeless Tobacco Never   The patient is not currently a tobacco user. Counseling given: Not Answered      Past Medical History:  Diagnosis Date   Abnormal Pap smear of cervix    yrs ago   Arthritis    Diverticulitis    Family history of adverse reaction to anesthesia    sister- gets wild    Graves disease    in remission    Graves' disease without crisis 1997   remission   History of diverticulitis    History of rheumatic fever age 59   Hyperlipidemia    Hypertension    Hypothyroidism    Pneumonia    hx  of years ago    Shingles 2021    Past Surgical History:  Procedure Laterality Date   BICEPS TENDON REPAIR Left    Left shoulder   CERVICAL CONE BIOPSY     yrs ago   COCCYX REMOVAL     sledding accident   KNEE RECONSTRUCTION Right    x 2   NASAL SEPTUM SURGERY     TOTAL HIP ARTHROPLASTY Right 08/21/2018   Procedure: TOTAL HIP ARTHROPLASTY ANTERIOR APPROACH;  Surgeon: Melrose Nakayama, MD;  Location: [facility];  Service: Orthopedics;  Laterality: Right;   TOTAL KNEE ARTHROPLASTY Right 2007    Family History  Problem Relation Age of Onset   Hyperlipidemia Mother    Hypertension Mother    Hypertension Father    Hyperlipidemia Father    Diabetes Sister    Hypertension Sister     Hyperlipidemia Sister    Hypertension Brother    Stroke Maternal Grandmother 66   Heart attack Paternal Grandmother    Breast cancer Neg Hx    Social History:  reports that she quit smoking about 13 years ago. Her smoking use included cigarettes. She has never used smokeless tobacco. She reports current alcohol use. She reports that she does not use drugs.  Allergies:  Allergies  Allergen Reactions   Ace Inhibitors Cough    Lisinopril   Morphine And Related Itching   Sulfa Antibiotics Rash    Medications Prior to Admission  Medication Sig Dispense Refill   amLODipine (NORVASC) 5 MG tablet Take 5 mg by mouth daily.     cetirizine (ZYRTEC) 10 MG tablet Take 10 mg by mouth at bedtime.      cyclobenzaprine (FLEXERIL) 10 MG tablet Take 10 mg by mouth 3 (three) times daily as needed for muscle spasms.     diclofenac Sodium (VOLTAREN) 1 % GEL Apply 2 g topically at bedtime.     HYDROcodone-acetaminophen (NORCO/VICODIN) 5-325 MG tablet Take 1 tablet by mouth every 6 (six) hours as needed for moderate pain.     Iron-Vitamin C (VITRON-C) 65-125 MG  TABS Take 1 tablet by mouth daily.     levothyroxine (SYNTHROID, LEVOTHROID) 25 MCG tablet Take 25 mcg by mouth daily before breakfast.   11   losartan (COZAAR) 100 MG tablet Take 100 mg by mouth daily.   3   Melatonin 10 MG TABS Take 10 mg by mouth at bedtime.     Menthol, Topical Analgesic, (BIOFREEZE EX) Apply 1 application topically daily as needed (hip pain).      metoprolol succinate (TOPROL-XL) 100 MG 24 hr tablet Take 100 mg by mouth daily.     metroNIDAZOLE (METROGEL) 0.75 % gel Apply 1 application topically daily as needed (rosacea).   11   montelukast (SINGULAIR) 10 MG tablet Take 10 mg by mouth at bedtime.      omeprazole (PRILOSEC) 40 MG capsule 40 mg daily.     simvastatin (ZOCOR) 20 MG tablet Take 20 mg by mouth at bedtime.      SYSTANE ULTRA 0.4-0.3 % SOLN Place 1 drop into both eyes daily as needed (Dry eyes).      No results  found for this or any previous visit (from the past 48 hour(s)). No results found.  Review of Systems  All other systems reviewed and are negative.   Blood pressure 129/73, pulse 68, temperature 98.5 F (36.9 C), temperature source Oral, resp. rate 20, height '5\' 3"'$  (1.6 m), weight 85.7 kg, last menstrual period 04/12/2003, SpO2 96 %. Physical Exam HENT:     Head: Atraumatic.  Eyes:     Extraocular Movements: Extraocular movements intact.  Cardiovascular:     Pulses: Normal pulses.  Musculoskeletal:     Comments: L shoulder pain with limited ROM. NVID.  Skin:    General: Skin is warm and dry.  Neurological:     Mental Status: She is alert.  Psychiatric:        Mood and Affect: Mood normal.      Assessment/Plan L shoulder endstage arthritis Plan L TSA Risks / benefits of surgery discussed Consent on chart  NPO for OR Preop antibiotics   Rhae Hammock, MD 12/16/2021, 7:11 AM

## 2021-12-16 NOTE — Discharge Instructions (Signed)
Discharge Instructions after Total Shoulder Arthroplasty   A sling has been provided for you. Remove the sling 5 times each day to perform motion exercises. After the first 48 to 72 hours, discontinue using the sling. You should use the sling as a protective device, if you are in a crowd.  Use ice on the shoulder intermittently over the first 48 hours after surgery.  Pain medication has been prescribed for you.  Use your medication liberally over the first 48 hours, and then begin to taper your use. You may take Extra Strength Tylenol or Tylenol only in place of the pain pills. DO NOT take ANY nonsteroidal anti-inflammatory pain medications: Advil, Motrin, Ibuprofen, Aleve, Naproxen, or Naprosyn. Take one aspirin a day for 2 weeks after surgery, unless you have an aspirin sensitivity/allergy or asthma. Leave your dressing on until your first follow up visit.  You may shower with the dressing.  Hold your arm as if you still have your sling on while you shower. Active reaching and lifting are not permitted. You may use the operative arm for activities of daily living that do not require the operative arm to leave the side of the body, such as eating, drinking, bathing, etc.  Three to 5 times each day you should perform assisted overhead reaching and external rotation (outward turning) exercises with the operative arm. You were taught these exercises prior to discharge. Both exercises should be done with the non-operative arm used as the "therapist arm" while the operative arm remains relaxed. Ten of each exercise should be done three to five times each day.   Overhead reach is helping to lift your stiff arm up as high as it will go. To stretch your overhead reach, lie flat on your back, relax, and grasp the wrist of the tight shoulder with your opposite hand. Using the power in your opposite arm, bring the stiff arm up as far as it is comfortable. Start holding it for ten seconds and then work up to where  you can hold it for a count of 30. Breathe slowly and deeply while the arm is moved. Repeat this stretch ten times, trying to help the ar up a little higher each time.     External rotation is turning the arm out to the side while your elbow stays close to your body. External rotation is best stretched while you are lying on your back. Hold a cane, yardstick, broom handle, or dowel in both hands. Bend both elbows to a right angle. Use steady, gentle force from your normal arm to rotate the hand of the stiff shoulder out away from your body. Continue the rotation until it is straight in front of you holding it there for a count of 10. Do not go beyond this level of rotation until seen back by Dr. Shanyce Daris. Repeat this exercise ten times slowly.      Please call [phone] during normal business hours or [phone] after hours for any problems. Including the following:  - excessive redness of the incisions - drainage for more than 4 days - fever of more than 101.5 F  *Please note that pain medications will not be refilled after hours or on weekends.    

## 2021-12-16 NOTE — Addendum Note (Signed)
Addendum  created 12/16/21 1031 by Eben Burow, CRNA   Charge Capture section accepted

## 2021-12-16 NOTE — Anesthesia Postprocedure Evaluation (Signed)
Anesthesia Post Note  Patient: Holly Beard  Procedure(s) Performed: TOTAL SHOULDER ARTHROPLASTY (Left: Shoulder)     Patient location during evaluation: PACU Anesthesia Type: General Level of consciousness: awake and alert Pain management: pain level controlled Vital Signs Assessment: post-procedure vital signs reviewed and stable Respiratory status: spontaneous breathing, nonlabored ventilation and respiratory function stable Cardiovascular status: blood pressure returned to baseline Postop Assessment: no apparent nausea or vomiting Anesthetic complications: no   No notable events documented.  Last Vitals:  Vitals:   12/16/21 0930 12/16/21 0945  BP: 97/63 96/71  Pulse: 73 69  Resp: 17 19  Temp:    SpO2: 92% 97%    Last Pain:  Vitals:   12/16/21 0945  TempSrc:   PainSc: 0-No pain                 Marthenia Rolling

## 2021-12-16 NOTE — Anesthesia Procedure Notes (Signed)
Procedure Name: Intubation Date/Time: 12/16/2021 7:28 AM  Performed by: Eben Burow, CRNAPre-anesthesia Checklist: Patient identified, Emergency Drugs available, Suction available, Patient being monitored and Timeout performed Patient Re-evaluated:Patient Re-evaluated prior to induction Oxygen Delivery Method: Circle system utilized Preoxygenation: Pre-oxygenation with 100% oxygen Induction Type: IV induction Ventilation: Mask ventilation without difficulty Laryngoscope Size: Mac and 4 Grade View: Grade I Tube type: Oral Tube size: 7.0 mm Number of attempts: 1 Airway Equipment and Method: Stylet Placement Confirmation: ETT inserted through vocal cords under direct vision, positive ETCO2 and breath sounds checked- equal and bilateral Secured at: 21 cm Tube secured with: Tape Dental Injury: Teeth and Oropharynx as per pre-operative assessment

## 2021-12-16 NOTE — Transfer of Care (Signed)
Immediate Anesthesia Transfer of Care Note  Patient: Talon Witting  Procedure(s) Performed: TOTAL SHOULDER ARTHROPLASTY (Left: Shoulder)  Patient Location: PACU  Anesthesia Type:General  Level of Consciousness: awake, alert  and patient cooperative  Airway & Oxygen Therapy: Patient Spontanous Breathing and Patient connected to face mask oxygen  Post-op Assessment: Report given to RN and Post -op Vital signs reviewed and stable  Post vital signs: Reviewed and stable  Last Vitals:  Vitals Value Taken Time  BP    Temp    Pulse 75 12/16/21 0923  Resp 15 12/16/21 0923  SpO2 95 % 12/16/21 0923  Vitals shown include unvalidated device data.  Last Pain:  Vitals:   12/16/21 0535  TempSrc: Oral  PainSc:       Patients Stated Pain Goal: 3 (83/35/82 5189)  Complications: No notable events documented.

## 2021-12-17 ENCOUNTER — Encounter (HOSPITAL_COMMUNITY): Payer: Self-pay | Admitting: Orthopedic Surgery

## 2021-12-29 DIAGNOSIS — Z471 Aftercare following joint replacement surgery: Secondary | ICD-10-CM | POA: Diagnosis not present

## 2021-12-29 DIAGNOSIS — Z96612 Presence of left artificial shoulder joint: Secondary | ICD-10-CM | POA: Diagnosis not present

## 2021-12-30 DIAGNOSIS — L918 Other hypertrophic disorders of the skin: Secondary | ICD-10-CM | POA: Diagnosis not present

## 2021-12-30 DIAGNOSIS — L821 Other seborrheic keratosis: Secondary | ICD-10-CM | POA: Diagnosis not present

## 2021-12-30 DIAGNOSIS — D485 Neoplasm of uncertain behavior of skin: Secondary | ICD-10-CM | POA: Diagnosis not present

## 2021-12-30 DIAGNOSIS — C44622 Squamous cell carcinoma of skin of right upper limb, including shoulder: Secondary | ICD-10-CM | POA: Diagnosis not present

## 2022-01-10 DIAGNOSIS — C44622 Squamous cell carcinoma of skin of right upper limb, including shoulder: Secondary | ICD-10-CM | POA: Diagnosis not present

## 2022-01-26 DIAGNOSIS — M6281 Muscle weakness (generalized): Secondary | ICD-10-CM | POA: Diagnosis not present

## 2022-01-26 DIAGNOSIS — M25612 Stiffness of left shoulder, not elsewhere classified: Secondary | ICD-10-CM | POA: Diagnosis not present

## 2022-01-26 DIAGNOSIS — Z96612 Presence of left artificial shoulder joint: Secondary | ICD-10-CM | POA: Diagnosis not present

## 2022-01-26 DIAGNOSIS — Z471 Aftercare following joint replacement surgery: Secondary | ICD-10-CM | POA: Diagnosis not present

## 2022-01-31 DIAGNOSIS — M6281 Muscle weakness (generalized): Secondary | ICD-10-CM | POA: Diagnosis not present

## 2022-01-31 DIAGNOSIS — M25612 Stiffness of left shoulder, not elsewhere classified: Secondary | ICD-10-CM | POA: Diagnosis not present

## 2022-01-31 DIAGNOSIS — Z96612 Presence of left artificial shoulder joint: Secondary | ICD-10-CM | POA: Diagnosis not present

## 2022-02-04 DIAGNOSIS — M25612 Stiffness of left shoulder, not elsewhere classified: Secondary | ICD-10-CM | POA: Diagnosis not present

## 2022-02-04 DIAGNOSIS — M6281 Muscle weakness (generalized): Secondary | ICD-10-CM | POA: Diagnosis not present

## 2022-02-04 DIAGNOSIS — Z96612 Presence of left artificial shoulder joint: Secondary | ICD-10-CM | POA: Diagnosis not present

## 2022-02-07 DIAGNOSIS — Z96612 Presence of left artificial shoulder joint: Secondary | ICD-10-CM | POA: Diagnosis not present

## 2022-02-07 DIAGNOSIS — M25612 Stiffness of left shoulder, not elsewhere classified: Secondary | ICD-10-CM | POA: Diagnosis not present

## 2022-02-07 DIAGNOSIS — M6281 Muscle weakness (generalized): Secondary | ICD-10-CM | POA: Diagnosis not present

## 2022-02-09 DIAGNOSIS — Z96612 Presence of left artificial shoulder joint: Secondary | ICD-10-CM | POA: Diagnosis not present

## 2022-02-09 DIAGNOSIS — M25612 Stiffness of left shoulder, not elsewhere classified: Secondary | ICD-10-CM | POA: Diagnosis not present

## 2022-02-09 DIAGNOSIS — M6281 Muscle weakness (generalized): Secondary | ICD-10-CM | POA: Diagnosis not present

## 2022-02-10 DIAGNOSIS — Z5189 Encounter for other specified aftercare: Secondary | ICD-10-CM | POA: Diagnosis not present

## 2022-02-10 DIAGNOSIS — Z8589 Personal history of malignant neoplasm of other organs and systems: Secondary | ICD-10-CM | POA: Diagnosis not present

## 2022-02-10 DIAGNOSIS — L918 Other hypertrophic disorders of the skin: Secondary | ICD-10-CM | POA: Diagnosis not present

## 2022-02-10 DIAGNOSIS — C44622 Squamous cell carcinoma of skin of right upper limb, including shoulder: Secondary | ICD-10-CM | POA: Diagnosis not present

## 2022-02-15 DIAGNOSIS — Z96612 Presence of left artificial shoulder joint: Secondary | ICD-10-CM | POA: Diagnosis not present

## 2022-02-15 DIAGNOSIS — M25612 Stiffness of left shoulder, not elsewhere classified: Secondary | ICD-10-CM | POA: Diagnosis not present

## 2022-02-15 DIAGNOSIS — M6281 Muscle weakness (generalized): Secondary | ICD-10-CM | POA: Diagnosis not present

## 2022-02-17 DIAGNOSIS — M6281 Muscle weakness (generalized): Secondary | ICD-10-CM | POA: Diagnosis not present

## 2022-02-17 DIAGNOSIS — Z96612 Presence of left artificial shoulder joint: Secondary | ICD-10-CM | POA: Diagnosis not present

## 2022-02-17 DIAGNOSIS — M25612 Stiffness of left shoulder, not elsewhere classified: Secondary | ICD-10-CM | POA: Diagnosis not present

## 2022-02-22 DIAGNOSIS — Z96612 Presence of left artificial shoulder joint: Secondary | ICD-10-CM | POA: Diagnosis not present

## 2022-02-22 DIAGNOSIS — M25612 Stiffness of left shoulder, not elsewhere classified: Secondary | ICD-10-CM | POA: Diagnosis not present

## 2022-02-22 DIAGNOSIS — M6281 Muscle weakness (generalized): Secondary | ICD-10-CM | POA: Diagnosis not present

## 2022-02-24 DIAGNOSIS — M25612 Stiffness of left shoulder, not elsewhere classified: Secondary | ICD-10-CM | POA: Diagnosis not present

## 2022-02-24 DIAGNOSIS — Z96612 Presence of left artificial shoulder joint: Secondary | ICD-10-CM | POA: Diagnosis not present

## 2022-02-24 DIAGNOSIS — M6281 Muscle weakness (generalized): Secondary | ICD-10-CM | POA: Diagnosis not present

## 2022-02-28 DIAGNOSIS — M6281 Muscle weakness (generalized): Secondary | ICD-10-CM | POA: Diagnosis not present

## 2022-02-28 DIAGNOSIS — M25612 Stiffness of left shoulder, not elsewhere classified: Secondary | ICD-10-CM | POA: Diagnosis not present

## 2022-02-28 DIAGNOSIS — Z96612 Presence of left artificial shoulder joint: Secondary | ICD-10-CM | POA: Diagnosis not present

## 2022-03-01 DIAGNOSIS — M25612 Stiffness of left shoulder, not elsewhere classified: Secondary | ICD-10-CM | POA: Diagnosis not present

## 2022-03-01 DIAGNOSIS — M6281 Muscle weakness (generalized): Secondary | ICD-10-CM | POA: Diagnosis not present

## 2022-03-01 DIAGNOSIS — Z96612 Presence of left artificial shoulder joint: Secondary | ICD-10-CM | POA: Diagnosis not present

## 2022-03-07 DIAGNOSIS — M6281 Muscle weakness (generalized): Secondary | ICD-10-CM | POA: Diagnosis not present

## 2022-03-07 DIAGNOSIS — Z96612 Presence of left artificial shoulder joint: Secondary | ICD-10-CM | POA: Diagnosis not present

## 2022-03-07 DIAGNOSIS — M25612 Stiffness of left shoulder, not elsewhere classified: Secondary | ICD-10-CM | POA: Diagnosis not present

## 2022-03-09 DIAGNOSIS — M25612 Stiffness of left shoulder, not elsewhere classified: Secondary | ICD-10-CM | POA: Diagnosis not present

## 2022-03-09 DIAGNOSIS — M6281 Muscle weakness (generalized): Secondary | ICD-10-CM | POA: Diagnosis not present

## 2022-03-09 DIAGNOSIS — Z96612 Presence of left artificial shoulder joint: Secondary | ICD-10-CM | POA: Diagnosis not present

## 2022-03-15 DIAGNOSIS — M25612 Stiffness of left shoulder, not elsewhere classified: Secondary | ICD-10-CM | POA: Diagnosis not present

## 2022-03-15 DIAGNOSIS — M6281 Muscle weakness (generalized): Secondary | ICD-10-CM | POA: Diagnosis not present

## 2022-03-15 DIAGNOSIS — Z96612 Presence of left artificial shoulder joint: Secondary | ICD-10-CM | POA: Diagnosis not present

## 2022-03-17 DIAGNOSIS — M6281 Muscle weakness (generalized): Secondary | ICD-10-CM | POA: Diagnosis not present

## 2022-03-17 DIAGNOSIS — Z96612 Presence of left artificial shoulder joint: Secondary | ICD-10-CM | POA: Diagnosis not present

## 2022-03-17 DIAGNOSIS — M25612 Stiffness of left shoulder, not elsewhere classified: Secondary | ICD-10-CM | POA: Diagnosis not present

## 2022-03-22 DIAGNOSIS — M25612 Stiffness of left shoulder, not elsewhere classified: Secondary | ICD-10-CM | POA: Diagnosis not present

## 2022-03-22 DIAGNOSIS — Z96612 Presence of left artificial shoulder joint: Secondary | ICD-10-CM | POA: Diagnosis not present

## 2022-03-22 DIAGNOSIS — M6281 Muscle weakness (generalized): Secondary | ICD-10-CM | POA: Diagnosis not present

## 2022-03-24 DIAGNOSIS — M25612 Stiffness of left shoulder, not elsewhere classified: Secondary | ICD-10-CM | POA: Diagnosis not present

## 2022-03-24 DIAGNOSIS — Z96612 Presence of left artificial shoulder joint: Secondary | ICD-10-CM | POA: Diagnosis not present

## 2022-03-24 DIAGNOSIS — M6281 Muscle weakness (generalized): Secondary | ICD-10-CM | POA: Diagnosis not present

## 2022-03-28 DIAGNOSIS — M6281 Muscle weakness (generalized): Secondary | ICD-10-CM | POA: Diagnosis not present

## 2022-03-28 DIAGNOSIS — Z96612 Presence of left artificial shoulder joint: Secondary | ICD-10-CM | POA: Diagnosis not present

## 2022-03-28 DIAGNOSIS — M25612 Stiffness of left shoulder, not elsewhere classified: Secondary | ICD-10-CM | POA: Diagnosis not present

## 2022-03-30 DIAGNOSIS — Z96612 Presence of left artificial shoulder joint: Secondary | ICD-10-CM | POA: Diagnosis not present

## 2022-03-30 DIAGNOSIS — M6281 Muscle weakness (generalized): Secondary | ICD-10-CM | POA: Diagnosis not present

## 2022-03-30 DIAGNOSIS — M25612 Stiffness of left shoulder, not elsewhere classified: Secondary | ICD-10-CM | POA: Diagnosis not present

## 2022-04-08 DIAGNOSIS — M6281 Muscle weakness (generalized): Secondary | ICD-10-CM | POA: Diagnosis not present

## 2022-04-08 DIAGNOSIS — Z96612 Presence of left artificial shoulder joint: Secondary | ICD-10-CM | POA: Diagnosis not present

## 2022-04-08 DIAGNOSIS — M25612 Stiffness of left shoulder, not elsewhere classified: Secondary | ICD-10-CM | POA: Diagnosis not present

## 2022-04-12 DIAGNOSIS — Z96612 Presence of left artificial shoulder joint: Secondary | ICD-10-CM | POA: Diagnosis not present

## 2022-04-12 DIAGNOSIS — M6281 Muscle weakness (generalized): Secondary | ICD-10-CM | POA: Diagnosis not present

## 2022-04-12 DIAGNOSIS — M25612 Stiffness of left shoulder, not elsewhere classified: Secondary | ICD-10-CM | POA: Diagnosis not present

## 2022-04-14 DIAGNOSIS — M6281 Muscle weakness (generalized): Secondary | ICD-10-CM | POA: Diagnosis not present

## 2022-04-14 DIAGNOSIS — Z96612 Presence of left artificial shoulder joint: Secondary | ICD-10-CM | POA: Diagnosis not present

## 2022-04-14 DIAGNOSIS — M25612 Stiffness of left shoulder, not elsewhere classified: Secondary | ICD-10-CM | POA: Diagnosis not present

## 2022-04-19 DIAGNOSIS — M6281 Muscle weakness (generalized): Secondary | ICD-10-CM | POA: Diagnosis not present

## 2022-04-19 DIAGNOSIS — Z96612 Presence of left artificial shoulder joint: Secondary | ICD-10-CM | POA: Diagnosis not present

## 2022-04-19 DIAGNOSIS — M25612 Stiffness of left shoulder, not elsewhere classified: Secondary | ICD-10-CM | POA: Diagnosis not present

## 2022-04-21 DIAGNOSIS — M6281 Muscle weakness (generalized): Secondary | ICD-10-CM | POA: Diagnosis not present

## 2022-04-21 DIAGNOSIS — M25612 Stiffness of left shoulder, not elsewhere classified: Secondary | ICD-10-CM | POA: Diagnosis not present

## 2022-04-21 DIAGNOSIS — Z96612 Presence of left artificial shoulder joint: Secondary | ICD-10-CM | POA: Diagnosis not present

## 2022-04-29 DIAGNOSIS — D2271 Melanocytic nevi of right lower limb, including hip: Secondary | ICD-10-CM | POA: Diagnosis not present

## 2022-04-29 DIAGNOSIS — M6281 Muscle weakness (generalized): Secondary | ICD-10-CM | POA: Diagnosis not present

## 2022-04-29 DIAGNOSIS — D229 Melanocytic nevi, unspecified: Secondary | ICD-10-CM | POA: Diagnosis not present

## 2022-04-29 DIAGNOSIS — L720 Epidermal cyst: Secondary | ICD-10-CM | POA: Diagnosis not present

## 2022-04-29 DIAGNOSIS — Z96612 Presence of left artificial shoulder joint: Secondary | ICD-10-CM | POA: Diagnosis not present

## 2022-04-29 DIAGNOSIS — L910 Hypertrophic scar: Secondary | ICD-10-CM | POA: Diagnosis not present

## 2022-04-29 DIAGNOSIS — L821 Other seborrheic keratosis: Secondary | ICD-10-CM | POA: Diagnosis not present

## 2022-04-29 DIAGNOSIS — L918 Other hypertrophic disorders of the skin: Secondary | ICD-10-CM | POA: Diagnosis not present

## 2022-04-29 DIAGNOSIS — L570 Actinic keratosis: Secondary | ICD-10-CM | POA: Diagnosis not present

## 2022-04-29 DIAGNOSIS — D1801 Hemangioma of skin and subcutaneous tissue: Secondary | ICD-10-CM | POA: Diagnosis not present

## 2022-04-29 DIAGNOSIS — L578 Other skin changes due to chronic exposure to nonionizing radiation: Secondary | ICD-10-CM | POA: Diagnosis not present

## 2022-04-29 DIAGNOSIS — Z8589 Personal history of malignant neoplasm of other organs and systems: Secondary | ICD-10-CM | POA: Diagnosis not present

## 2022-04-29 DIAGNOSIS — M25612 Stiffness of left shoulder, not elsewhere classified: Secondary | ICD-10-CM | POA: Diagnosis not present

## 2022-04-29 DIAGNOSIS — D485 Neoplasm of uncertain behavior of skin: Secondary | ICD-10-CM | POA: Diagnosis not present

## 2022-05-03 DIAGNOSIS — Z96612 Presence of left artificial shoulder joint: Secondary | ICD-10-CM | POA: Diagnosis not present

## 2022-05-03 DIAGNOSIS — M6281 Muscle weakness (generalized): Secondary | ICD-10-CM | POA: Diagnosis not present

## 2022-05-03 DIAGNOSIS — M25612 Stiffness of left shoulder, not elsewhere classified: Secondary | ICD-10-CM | POA: Diagnosis not present

## 2022-05-10 DIAGNOSIS — M6281 Muscle weakness (generalized): Secondary | ICD-10-CM | POA: Diagnosis not present

## 2022-05-10 DIAGNOSIS — M25612 Stiffness of left shoulder, not elsewhere classified: Secondary | ICD-10-CM | POA: Diagnosis not present

## 2022-05-10 DIAGNOSIS — Z96612 Presence of left artificial shoulder joint: Secondary | ICD-10-CM | POA: Diagnosis not present

## 2022-05-17 DIAGNOSIS — M25612 Stiffness of left shoulder, not elsewhere classified: Secondary | ICD-10-CM | POA: Diagnosis not present

## 2022-05-17 DIAGNOSIS — Z96612 Presence of left artificial shoulder joint: Secondary | ICD-10-CM | POA: Diagnosis not present

## 2022-05-17 DIAGNOSIS — M6281 Muscle weakness (generalized): Secondary | ICD-10-CM | POA: Diagnosis not present

## 2022-05-19 ENCOUNTER — Other Ambulatory Visit: Payer: Self-pay | Admitting: Family Medicine

## 2022-05-19 DIAGNOSIS — Z1231 Encounter for screening mammogram for malignant neoplasm of breast: Secondary | ICD-10-CM

## 2022-05-20 DIAGNOSIS — Z9109 Other allergy status, other than to drugs and biological substances: Secondary | ICD-10-CM | POA: Diagnosis not present

## 2022-05-20 DIAGNOSIS — Z1159 Encounter for screening for other viral diseases: Secondary | ICD-10-CM | POA: Diagnosis not present

## 2022-05-20 DIAGNOSIS — E538 Deficiency of other specified B group vitamins: Secondary | ICD-10-CM | POA: Diagnosis not present

## 2022-05-20 DIAGNOSIS — I700 Atherosclerosis of aorta: Secondary | ICD-10-CM | POA: Diagnosis not present

## 2022-05-20 DIAGNOSIS — M150 Primary generalized (osteo)arthritis: Secondary | ICD-10-CM | POA: Diagnosis not present

## 2022-05-20 DIAGNOSIS — Z862 Personal history of diseases of the blood and blood-forming organs and certain disorders involving the immune mechanism: Secondary | ICD-10-CM | POA: Diagnosis not present

## 2022-05-20 DIAGNOSIS — Z0000 Encounter for general adult medical examination without abnormal findings: Secondary | ICD-10-CM | POA: Diagnosis not present

## 2022-05-20 DIAGNOSIS — I10 Essential (primary) hypertension: Secondary | ICD-10-CM | POA: Diagnosis not present

## 2022-05-20 DIAGNOSIS — E039 Hypothyroidism, unspecified: Secondary | ICD-10-CM | POA: Diagnosis not present

## 2022-05-20 DIAGNOSIS — E782 Mixed hyperlipidemia: Secondary | ICD-10-CM | POA: Diagnosis not present

## 2022-05-20 DIAGNOSIS — E559 Vitamin D deficiency, unspecified: Secondary | ICD-10-CM | POA: Diagnosis not present

## 2022-05-23 DIAGNOSIS — Z96612 Presence of left artificial shoulder joint: Secondary | ICD-10-CM | POA: Diagnosis not present

## 2022-05-23 DIAGNOSIS — M25612 Stiffness of left shoulder, not elsewhere classified: Secondary | ICD-10-CM | POA: Diagnosis not present

## 2022-05-23 DIAGNOSIS — M6281 Muscle weakness (generalized): Secondary | ICD-10-CM | POA: Diagnosis not present

## 2022-05-25 ENCOUNTER — Other Ambulatory Visit: Payer: Self-pay | Admitting: Family Medicine

## 2022-05-25 DIAGNOSIS — E2839 Other primary ovarian failure: Secondary | ICD-10-CM

## 2022-06-16 DIAGNOSIS — H0102A Squamous blepharitis right eye, upper and lower eyelids: Secondary | ICD-10-CM | POA: Diagnosis not present

## 2022-06-16 DIAGNOSIS — H0102B Squamous blepharitis left eye, upper and lower eyelids: Secondary | ICD-10-CM | POA: Diagnosis not present

## 2022-06-16 DIAGNOSIS — H2513 Age-related nuclear cataract, bilateral: Secondary | ICD-10-CM | POA: Diagnosis not present

## 2022-06-16 DIAGNOSIS — H40013 Open angle with borderline findings, low risk, bilateral: Secondary | ICD-10-CM | POA: Diagnosis not present

## 2022-06-20 DIAGNOSIS — M25612 Stiffness of left shoulder, not elsewhere classified: Secondary | ICD-10-CM | POA: Diagnosis not present

## 2022-07-06 ENCOUNTER — Ambulatory Visit
Admission: RE | Admit: 2022-07-06 | Discharge: 2022-07-06 | Disposition: A | Discharge status: Home / Self Care | Payer: Medicare Other | Source: Ambulatory Visit | Attending: Family Medicine | Admitting: Family Medicine

## 2022-07-06 DIAGNOSIS — Z1231 Encounter for screening mammogram for malignant neoplasm of breast: Secondary | ICD-10-CM

## 2022-08-17 DIAGNOSIS — M9903 Segmental and somatic dysfunction of lumbar region: Secondary | ICD-10-CM | POA: Diagnosis not present

## 2022-08-17 DIAGNOSIS — M9904 Segmental and somatic dysfunction of sacral region: Secondary | ICD-10-CM | POA: Diagnosis not present

## 2022-08-17 DIAGNOSIS — M5136 Other intervertebral disc degeneration, lumbar region: Secondary | ICD-10-CM | POA: Diagnosis not present

## 2022-08-17 DIAGNOSIS — M9905 Segmental and somatic dysfunction of pelvic region: Secondary | ICD-10-CM | POA: Diagnosis not present

## 2022-08-24 DIAGNOSIS — M9903 Segmental and somatic dysfunction of lumbar region: Secondary | ICD-10-CM | POA: Diagnosis not present

## 2022-08-24 DIAGNOSIS — M5136 Other intervertebral disc degeneration, lumbar region: Secondary | ICD-10-CM | POA: Diagnosis not present

## 2022-08-24 DIAGNOSIS — M9904 Segmental and somatic dysfunction of sacral region: Secondary | ICD-10-CM | POA: Diagnosis not present

## 2022-08-24 DIAGNOSIS — M9905 Segmental and somatic dysfunction of pelvic region: Secondary | ICD-10-CM | POA: Diagnosis not present

## 2022-08-30 DIAGNOSIS — M5136 Other intervertebral disc degeneration, lumbar region: Secondary | ICD-10-CM | POA: Diagnosis not present

## 2022-08-30 DIAGNOSIS — M9904 Segmental and somatic dysfunction of sacral region: Secondary | ICD-10-CM | POA: Diagnosis not present

## 2022-08-30 DIAGNOSIS — M9905 Segmental and somatic dysfunction of pelvic region: Secondary | ICD-10-CM | POA: Diagnosis not present

## 2022-08-30 DIAGNOSIS — M9903 Segmental and somatic dysfunction of lumbar region: Secondary | ICD-10-CM | POA: Diagnosis not present

## 2022-10-27 ENCOUNTER — Ambulatory Visit
Admission: RE | Admit: 2022-10-27 | Discharge: 2022-10-27 | Disposition: A | Discharge status: Home / Self Care | Payer: Medicare Other | Source: Ambulatory Visit | Attending: Family Medicine | Admitting: Family Medicine

## 2022-10-27 DIAGNOSIS — E0500 Thyrotoxicosis with diffuse goiter without thyrotoxic crisis or storm: Secondary | ICD-10-CM | POA: Diagnosis not present

## 2022-10-27 DIAGNOSIS — E349 Endocrine disorder, unspecified: Secondary | ICD-10-CM | POA: Diagnosis not present

## 2022-10-27 DIAGNOSIS — E2839 Other primary ovarian failure: Secondary | ICD-10-CM

## 2022-11-08 DIAGNOSIS — M9903 Segmental and somatic dysfunction of lumbar region: Secondary | ICD-10-CM | POA: Diagnosis not present

## 2022-11-08 DIAGNOSIS — M9904 Segmental and somatic dysfunction of sacral region: Secondary | ICD-10-CM | POA: Diagnosis not present

## 2022-11-08 DIAGNOSIS — M9905 Segmental and somatic dysfunction of pelvic region: Secondary | ICD-10-CM | POA: Diagnosis not present

## 2022-11-08 DIAGNOSIS — M5136 Other intervertebral disc degeneration, lumbar region: Secondary | ICD-10-CM | POA: Diagnosis not present

## 2022-11-15 DIAGNOSIS — M5136 Other intervertebral disc degeneration, lumbar region: Secondary | ICD-10-CM | POA: Diagnosis not present

## 2022-11-15 DIAGNOSIS — M9904 Segmental and somatic dysfunction of sacral region: Secondary | ICD-10-CM | POA: Diagnosis not present

## 2022-11-15 DIAGNOSIS — M9905 Segmental and somatic dysfunction of pelvic region: Secondary | ICD-10-CM | POA: Diagnosis not present

## 2022-11-15 DIAGNOSIS — M9903 Segmental and somatic dysfunction of lumbar region: Secondary | ICD-10-CM | POA: Diagnosis not present

## 2022-11-18 DIAGNOSIS — E559 Vitamin D deficiency, unspecified: Secondary | ICD-10-CM | POA: Diagnosis not present

## 2022-11-18 DIAGNOSIS — M150 Primary generalized (osteo)arthritis: Secondary | ICD-10-CM | POA: Diagnosis not present

## 2022-11-18 DIAGNOSIS — G5761 Lesion of plantar nerve, right lower limb: Secondary | ICD-10-CM | POA: Diagnosis not present

## 2022-11-18 DIAGNOSIS — G8929 Other chronic pain: Secondary | ICD-10-CM | POA: Diagnosis not present

## 2022-11-18 DIAGNOSIS — E538 Deficiency of other specified B group vitamins: Secondary | ICD-10-CM | POA: Diagnosis not present

## 2022-11-18 DIAGNOSIS — N289 Disorder of kidney and ureter, unspecified: Secondary | ICD-10-CM | POA: Diagnosis not present

## 2022-11-18 DIAGNOSIS — D509 Iron deficiency anemia, unspecified: Secondary | ICD-10-CM | POA: Diagnosis not present

## 2022-11-23 DIAGNOSIS — M9903 Segmental and somatic dysfunction of lumbar region: Secondary | ICD-10-CM | POA: Diagnosis not present

## 2022-11-23 DIAGNOSIS — M9904 Segmental and somatic dysfunction of sacral region: Secondary | ICD-10-CM | POA: Diagnosis not present

## 2022-11-23 DIAGNOSIS — M9905 Segmental and somatic dysfunction of pelvic region: Secondary | ICD-10-CM | POA: Diagnosis not present

## 2022-11-23 DIAGNOSIS — M5136 Other intervertebral disc degeneration, lumbar region: Secondary | ICD-10-CM | POA: Diagnosis not present

## 2022-11-29 DIAGNOSIS — M9905 Segmental and somatic dysfunction of pelvic region: Secondary | ICD-10-CM | POA: Diagnosis not present

## 2022-11-29 DIAGNOSIS — M9903 Segmental and somatic dysfunction of lumbar region: Secondary | ICD-10-CM | POA: Diagnosis not present

## 2022-11-29 DIAGNOSIS — M5136 Other intervertebral disc degeneration, lumbar region: Secondary | ICD-10-CM | POA: Diagnosis not present

## 2022-11-29 DIAGNOSIS — M9904 Segmental and somatic dysfunction of sacral region: Secondary | ICD-10-CM | POA: Diagnosis not present

## 2022-12-19 DIAGNOSIS — M9905 Segmental and somatic dysfunction of pelvic region: Secondary | ICD-10-CM | POA: Diagnosis not present

## 2022-12-19 DIAGNOSIS — M9903 Segmental and somatic dysfunction of lumbar region: Secondary | ICD-10-CM | POA: Diagnosis not present

## 2022-12-19 DIAGNOSIS — M5136 Other intervertebral disc degeneration, lumbar region: Secondary | ICD-10-CM | POA: Diagnosis not present

## 2022-12-19 DIAGNOSIS — M9904 Segmental and somatic dysfunction of sacral region: Secondary | ICD-10-CM | POA: Diagnosis not present

## 2022-12-21 DIAGNOSIS — M25612 Stiffness of left shoulder, not elsewhere classified: Secondary | ICD-10-CM | POA: Diagnosis not present

## 2023-01-11 ENCOUNTER — Other Ambulatory Visit: Payer: Self-pay | Admitting: Family Medicine

## 2023-01-11 DIAGNOSIS — G8929 Other chronic pain: Secondary | ICD-10-CM | POA: Diagnosis not present

## 2023-01-11 DIAGNOSIS — M545 Low back pain, unspecified: Secondary | ICD-10-CM

## 2023-01-11 DIAGNOSIS — M25551 Pain in right hip: Secondary | ICD-10-CM

## 2023-01-25 DIAGNOSIS — M19011 Primary osteoarthritis, right shoulder: Secondary | ICD-10-CM | POA: Diagnosis not present

## 2023-01-26 ENCOUNTER — Ambulatory Visit
Admission: RE | Admit: 2023-01-26 | Discharge: 2023-01-26 | Disposition: A | Discharge status: Home / Self Care | Payer: Medicare Other | Source: Ambulatory Visit | Attending: Family Medicine | Admitting: Family Medicine

## 2023-01-26 DIAGNOSIS — M48061 Spinal stenosis, lumbar region without neurogenic claudication: Secondary | ICD-10-CM | POA: Diagnosis not present

## 2023-01-26 DIAGNOSIS — M545 Low back pain, unspecified: Secondary | ICD-10-CM

## 2023-01-26 DIAGNOSIS — M1812 Unilateral primary osteoarthritis of first carpometacarpal joint, left hand: Secondary | ICD-10-CM | POA: Diagnosis not present

## 2023-01-26 DIAGNOSIS — M5126 Other intervertebral disc displacement, lumbar region: Secondary | ICD-10-CM | POA: Diagnosis not present

## 2023-01-26 DIAGNOSIS — M25551 Pain in right hip: Secondary | ICD-10-CM

## 2023-01-26 DIAGNOSIS — M1811 Unilateral primary osteoarthritis of first carpometacarpal joint, right hand: Secondary | ICD-10-CM | POA: Diagnosis not present

## 2023-02-09 DIAGNOSIS — M9905 Segmental and somatic dysfunction of pelvic region: Secondary | ICD-10-CM | POA: Diagnosis not present

## 2023-02-09 DIAGNOSIS — M9904 Segmental and somatic dysfunction of sacral region: Secondary | ICD-10-CM | POA: Diagnosis not present

## 2023-02-09 DIAGNOSIS — M9903 Segmental and somatic dysfunction of lumbar region: Secondary | ICD-10-CM | POA: Diagnosis not present

## 2023-02-09 DIAGNOSIS — M5136 Other intervertebral disc degeneration, lumbar region with discogenic back pain only: Secondary | ICD-10-CM | POA: Diagnosis not present

## 2023-02-17 DIAGNOSIS — M546 Pain in thoracic spine: Secondary | ICD-10-CM | POA: Diagnosis not present

## 2023-02-17 DIAGNOSIS — M545 Low back pain, unspecified: Secondary | ICD-10-CM | POA: Diagnosis not present

## 2023-03-02 DIAGNOSIS — M1812 Unilateral primary osteoarthritis of first carpometacarpal joint, left hand: Secondary | ICD-10-CM | POA: Diagnosis not present

## 2023-03-02 DIAGNOSIS — M1811 Unilateral primary osteoarthritis of first carpometacarpal joint, right hand: Secondary | ICD-10-CM | POA: Diagnosis not present

## 2023-03-08 ENCOUNTER — Other Ambulatory Visit: Payer: Self-pay | Admitting: Orthopedic Surgery

## 2023-03-08 DIAGNOSIS — M546 Pain in thoracic spine: Secondary | ICD-10-CM

## 2023-03-08 DIAGNOSIS — M545 Low back pain, unspecified: Secondary | ICD-10-CM

## 2023-04-02 ENCOUNTER — Ambulatory Visit
Admission: RE | Admit: 2023-04-02 | Discharge: 2023-04-02 | Disposition: A | Discharge status: Home / Self Care | Payer: Medicare Other | Source: Ambulatory Visit | Attending: Orthopedic Surgery | Admitting: Orthopedic Surgery

## 2023-04-02 DIAGNOSIS — M48061 Spinal stenosis, lumbar region without neurogenic claudication: Secondary | ICD-10-CM | POA: Diagnosis not present

## 2023-04-02 DIAGNOSIS — M545 Low back pain, unspecified: Secondary | ICD-10-CM

## 2023-04-02 DIAGNOSIS — M5124 Other intervertebral disc displacement, thoracic region: Secondary | ICD-10-CM | POA: Diagnosis not present

## 2023-04-02 DIAGNOSIS — M546 Pain in thoracic spine: Secondary | ICD-10-CM

## 2023-04-02 DIAGNOSIS — M4316 Spondylolisthesis, lumbar region: Secondary | ICD-10-CM | POA: Diagnosis not present

## 2023-04-10 DIAGNOSIS — M546 Pain in thoracic spine: Secondary | ICD-10-CM | POA: Diagnosis not present

## 2023-05-02 DIAGNOSIS — D1801 Hemangioma of skin and subcutaneous tissue: Secondary | ICD-10-CM | POA: Diagnosis not present

## 2023-05-02 DIAGNOSIS — L570 Actinic keratosis: Secondary | ICD-10-CM | POA: Diagnosis not present

## 2023-05-02 DIAGNOSIS — L578 Other skin changes due to chronic exposure to nonionizing radiation: Secondary | ICD-10-CM | POA: Diagnosis not present

## 2023-05-02 DIAGNOSIS — D229 Melanocytic nevi, unspecified: Secondary | ICD-10-CM | POA: Diagnosis not present

## 2023-05-02 DIAGNOSIS — D2271 Melanocytic nevi of right lower limb, including hip: Secondary | ICD-10-CM | POA: Diagnosis not present

## 2023-05-02 DIAGNOSIS — Z8589 Personal history of malignant neoplasm of other organs and systems: Secondary | ICD-10-CM | POA: Diagnosis not present

## 2023-05-02 DIAGNOSIS — L821 Other seborrheic keratosis: Secondary | ICD-10-CM | POA: Diagnosis not present

## 2023-05-02 DIAGNOSIS — L814 Other melanin hyperpigmentation: Secondary | ICD-10-CM | POA: Diagnosis not present

## 2023-05-02 DIAGNOSIS — L738 Other specified follicular disorders: Secondary | ICD-10-CM | POA: Diagnosis not present

## 2023-05-02 DIAGNOSIS — L910 Hypertrophic scar: Secondary | ICD-10-CM | POA: Diagnosis not present

## 2023-05-11 DIAGNOSIS — M48061 Spinal stenosis, lumbar region without neurogenic claudication: Secondary | ICD-10-CM | POA: Diagnosis not present

## 2023-05-11 DIAGNOSIS — M5126 Other intervertebral disc displacement, lumbar region: Secondary | ICD-10-CM | POA: Diagnosis not present

## 2023-05-11 DIAGNOSIS — R053 Chronic cough: Secondary | ICD-10-CM | POA: Diagnosis not present

## 2023-05-11 DIAGNOSIS — M150 Primary generalized (osteo)arthritis: Secondary | ICD-10-CM | POA: Diagnosis not present

## 2023-05-11 DIAGNOSIS — R0981 Nasal congestion: Secondary | ICD-10-CM | POA: Diagnosis not present

## 2023-05-25 DIAGNOSIS — E039 Hypothyroidism, unspecified: Secondary | ICD-10-CM | POA: Diagnosis not present

## 2023-05-25 DIAGNOSIS — R7309 Other abnormal glucose: Secondary | ICD-10-CM | POA: Diagnosis not present

## 2023-05-25 DIAGNOSIS — E538 Deficiency of other specified B group vitamins: Secondary | ICD-10-CM | POA: Diagnosis not present

## 2023-05-25 DIAGNOSIS — I10 Essential (primary) hypertension: Secondary | ICD-10-CM | POA: Diagnosis not present

## 2023-05-25 DIAGNOSIS — E782 Mixed hyperlipidemia: Secondary | ICD-10-CM | POA: Diagnosis not present

## 2023-05-25 DIAGNOSIS — Z0000 Encounter for general adult medical examination without abnormal findings: Secondary | ICD-10-CM | POA: Diagnosis not present

## 2023-05-25 DIAGNOSIS — I700 Atherosclerosis of aorta: Secondary | ICD-10-CM | POA: Diagnosis not present

## 2023-05-25 DIAGNOSIS — R1011 Right upper quadrant pain: Secondary | ICD-10-CM | POA: Diagnosis not present

## 2023-05-25 DIAGNOSIS — E559 Vitamin D deficiency, unspecified: Secondary | ICD-10-CM | POA: Diagnosis not present

## 2023-05-25 DIAGNOSIS — R1031 Right lower quadrant pain: Secondary | ICD-10-CM | POA: Diagnosis not present

## 2023-05-29 ENCOUNTER — Other Ambulatory Visit: Payer: Self-pay | Admitting: Family Medicine

## 2023-05-29 DIAGNOSIS — R109 Unspecified abdominal pain: Secondary | ICD-10-CM

## 2023-06-22 DIAGNOSIS — H40013 Open angle with borderline findings, low risk, bilateral: Secondary | ICD-10-CM | POA: Diagnosis not present

## 2023-06-22 DIAGNOSIS — H0102A Squamous blepharitis right eye, upper and lower eyelids: Secondary | ICD-10-CM | POA: Diagnosis not present

## 2023-06-22 DIAGNOSIS — H0102B Squamous blepharitis left eye, upper and lower eyelids: Secondary | ICD-10-CM | POA: Diagnosis not present

## 2023-06-22 DIAGNOSIS — H2513 Age-related nuclear cataract, bilateral: Secondary | ICD-10-CM | POA: Diagnosis not present

## 2023-06-23 ENCOUNTER — Ambulatory Visit
Admission: RE | Admit: 2023-06-23 | Discharge: 2023-06-23 | Disposition: A | Discharge status: Home / Self Care | Payer: Medicare Other | Source: Ambulatory Visit | Attending: Family Medicine | Admitting: Family Medicine

## 2023-06-23 DIAGNOSIS — R109 Unspecified abdominal pain: Secondary | ICD-10-CM

## 2023-06-23 DIAGNOSIS — E785 Hyperlipidemia, unspecified: Secondary | ICD-10-CM | POA: Diagnosis not present

## 2023-06-23 DIAGNOSIS — I10 Essential (primary) hypertension: Secondary | ICD-10-CM | POA: Diagnosis not present

## 2023-06-23 MED ORDER — IOPAMIDOL (ISOVUE-300) INJECTION 61%
500.0000 mL | Freq: Once | INTRAVENOUS | Status: AC | PRN
  Administered 2023-06-23: 100 mL via INTRAVENOUS

## 2023-07-03 ENCOUNTER — Emergency Department (HOSPITAL_BASED_OUTPATIENT_CLINIC_OR_DEPARTMENT_OTHER)

## 2023-07-03 ENCOUNTER — Encounter (HOSPITAL_BASED_OUTPATIENT_CLINIC_OR_DEPARTMENT_OTHER): Payer: Self-pay

## 2023-07-03 ENCOUNTER — Other Ambulatory Visit: Payer: Self-pay

## 2023-07-03 ENCOUNTER — Emergency Department (HOSPITAL_BASED_OUTPATIENT_CLINIC_OR_DEPARTMENT_OTHER)
Admission: EM | Admit: 2023-07-03 | Discharge: 2023-07-03 | Disposition: A | Discharge status: Home / Self Care | Attending: Emergency Medicine | Admitting: Emergency Medicine

## 2023-07-03 DIAGNOSIS — Z96641 Presence of right artificial hip joint: Secondary | ICD-10-CM | POA: Diagnosis not present

## 2023-07-03 DIAGNOSIS — R0602 Shortness of breath: Secondary | ICD-10-CM | POA: Insufficient documentation

## 2023-07-03 DIAGNOSIS — R0789 Other chest pain: Secondary | ICD-10-CM | POA: Diagnosis not present

## 2023-07-03 DIAGNOSIS — M8580 Other specified disorders of bone density and structure, unspecified site: Secondary | ICD-10-CM | POA: Diagnosis not present

## 2023-07-03 DIAGNOSIS — M542 Cervicalgia: Secondary | ICD-10-CM | POA: Diagnosis not present

## 2023-07-03 DIAGNOSIS — I119 Hypertensive heart disease without heart failure: Secondary | ICD-10-CM | POA: Insufficient documentation

## 2023-07-03 DIAGNOSIS — R7989 Other specified abnormal findings of blood chemistry: Secondary | ICD-10-CM | POA: Diagnosis not present

## 2023-07-03 DIAGNOSIS — I10 Essential (primary) hypertension: Secondary | ICD-10-CM | POA: Insufficient documentation

## 2023-07-03 DIAGNOSIS — K5730 Diverticulosis of large intestine without perforation or abscess without bleeding: Secondary | ICD-10-CM | POA: Diagnosis not present

## 2023-07-03 DIAGNOSIS — M545 Low back pain, unspecified: Secondary | ICD-10-CM | POA: Diagnosis not present

## 2023-07-03 DIAGNOSIS — R0600 Dyspnea, unspecified: Secondary | ICD-10-CM | POA: Insufficient documentation

## 2023-07-03 DIAGNOSIS — M791 Myalgia, unspecified site: Secondary | ICD-10-CM | POA: Insufficient documentation

## 2023-07-03 DIAGNOSIS — Z79899 Other long term (current) drug therapy: Secondary | ICD-10-CM | POA: Insufficient documentation

## 2023-07-03 DIAGNOSIS — N200 Calculus of kidney: Secondary | ICD-10-CM | POA: Diagnosis not present

## 2023-07-03 DIAGNOSIS — Z87891 Personal history of nicotine dependence: Secondary | ICD-10-CM | POA: Diagnosis not present

## 2023-07-03 DIAGNOSIS — Z96612 Presence of left artificial shoulder joint: Secondary | ICD-10-CM | POA: Insufficient documentation

## 2023-07-03 DIAGNOSIS — K760 Fatty (change of) liver, not elsewhere classified: Secondary | ICD-10-CM | POA: Diagnosis not present

## 2023-07-03 DIAGNOSIS — I700 Atherosclerosis of aorta: Secondary | ICD-10-CM | POA: Insufficient documentation

## 2023-07-03 DIAGNOSIS — Z96651 Presence of right artificial knee joint: Secondary | ICD-10-CM | POA: Diagnosis not present

## 2023-07-03 DIAGNOSIS — E039 Hypothyroidism, unspecified: Secondary | ICD-10-CM | POA: Diagnosis not present

## 2023-07-03 DIAGNOSIS — I2510 Atherosclerotic heart disease of native coronary artery without angina pectoris: Secondary | ICD-10-CM | POA: Insufficient documentation

## 2023-07-03 LAB — HEPATIC FUNCTION PANEL
ALT: 11 U/L (ref 0–44)
AST: 12 U/L — ABNORMAL LOW (ref 15–41)
Albumin: 3.9 g/dL (ref 3.5–5.0)
Alkaline Phosphatase: 59 U/L (ref 38–126)
Bilirubin, Direct: 0.2 mg/dL (ref 0.0–0.2)
Indirect Bilirubin: 1.3 mg/dL — ABNORMAL HIGH (ref 0.3–0.9)
Total Bilirubin: 1.5 mg/dL — ABNORMAL HIGH (ref 0.0–1.2)
Total Protein: 6.8 g/dL (ref 6.5–8.1)

## 2023-07-03 LAB — CBC WITH DIFFERENTIAL/PLATELET
Abs Immature Granulocytes: 0.05 10*3/uL (ref 0.00–0.07)
Basophils Absolute: 0 10*3/uL (ref 0.0–0.1)
Basophils Relative: 0 %
Eosinophils Absolute: 0.1 10*3/uL (ref 0.0–0.5)
Eosinophils Relative: 1 %
HCT: 38.2 % (ref 36.0–46.0)
Hemoglobin: 12.8 g/dL (ref 12.0–15.0)
Immature Granulocytes: 1 %
Lymphocytes Relative: 12 %
Lymphs Abs: 1 10*3/uL (ref 0.7–4.0)
MCH: 29.4 pg (ref 26.0–34.0)
MCHC: 33.5 g/dL (ref 30.0–36.0)
MCV: 87.8 fL (ref 80.0–100.0)
Monocytes Absolute: 0.8 10*3/uL (ref 0.1–1.0)
Monocytes Relative: 9 %
Neutro Abs: 6.6 10*3/uL (ref 1.7–7.7)
Neutrophils Relative %: 77 %
Platelets: 238 10*3/uL (ref 150–400)
RBC: 4.35 MIL/uL (ref 3.87–5.11)
RDW: 13.1 % (ref 11.5–15.5)
WBC: 8.5 10*3/uL (ref 4.0–10.5)
nRBC: 0 % (ref 0.0–0.2)

## 2023-07-03 LAB — URINALYSIS, W/ REFLEX TO CULTURE (INFECTION SUSPECTED)
Bacteria, UA: NONE SEEN
Bilirubin Urine: NEGATIVE
Glucose, UA: NEGATIVE mg/dL
Ketones, ur: NEGATIVE mg/dL
Leukocytes,Ua: NEGATIVE
Nitrite: NEGATIVE
Protein, ur: NEGATIVE mg/dL
Specific Gravity, Urine: 1.033 — ABNORMAL HIGH (ref 1.005–1.030)
pH: 6 (ref 5.0–8.0)

## 2023-07-03 LAB — TROPONIN I (HIGH SENSITIVITY)
Troponin I (High Sensitivity): 2 ng/L (ref <18)
Troponin I (High Sensitivity): 3 ng/L (ref <18)

## 2023-07-03 LAB — BASIC METABOLIC PANEL
Anion gap: 10 (ref 5–15)
BUN: 10 mg/dL (ref 8–23)
CO2: 24 mmol/L (ref 22–32)
Calcium: 9 mg/dL (ref 8.9–10.3)
Chloride: 102 mmol/L (ref 98–111)
Creatinine, Ser: 0.8 mg/dL (ref 0.44–1.00)
GFR, Estimated: >60 mL/min (ref >60)
Glucose, Bld: 113 mg/dL — ABNORMAL HIGH (ref 70–99)
Potassium: 3.9 mmol/L (ref 3.5–5.1)
Sodium: 136 mmol/L (ref 135–145)

## 2023-07-03 LAB — RESP PANEL BY RT-PCR (RSV, FLU A&B, COVID)  RVPGX2
Influenza A by PCR: NEGATIVE
Influenza B by PCR: NEGATIVE
Resp Syncytial Virus by PCR: NEGATIVE
SARS Coronavirus 2 by RT PCR: NEGATIVE

## 2023-07-03 LAB — BRAIN NATRIURETIC PEPTIDE: B Natriuretic Peptide: 55.7 pg/mL (ref 0.0–100.0)

## 2023-07-03 LAB — D-DIMER, QUANTITATIVE: D-Dimer, Quant: 0.83 ug{FEU}/mL — ABNORMAL HIGH (ref 0.00–0.50)

## 2023-07-03 MED ORDER — CYCLOBENZAPRINE HCL 5 MG PO TABS
5.0000 mg | ORAL_TABLET | Freq: Once | ORAL | Status: AC
  Administered 2023-07-03: 5 mg via ORAL
  Filled 2023-07-03: qty 1

## 2023-07-03 MED ORDER — LIDOCAINE 5 % EX PTCH: 1.0000 | MEDICATED_PATCH | CUTANEOUS | 0 refills | Status: DC

## 2023-07-03 MED ORDER — DIAZEPAM 5 MG/ML IJ SOLN
5.0000 mg | Freq: Once | INTRAMUSCULAR | Status: AC
  Administered 2023-07-03: 5 mg via INTRAVENOUS
  Filled 2023-07-03: qty 2

## 2023-07-03 MED ORDER — TIZANIDINE HCL 4 MG PO TABS: 4.0000 mg | ORAL_TABLET | Freq: Four times a day (QID) | ORAL | 0 refills | Status: DC | PRN

## 2023-07-03 MED ORDER — KETOROLAC TROMETHAMINE 15 MG/ML IJ SOLN
15.0000 mg | Freq: Once | INTRAMUSCULAR | Status: AC
Start: 2023-07-03 — End: 2023-07-03
  Administered 2023-07-03: 15 mg via INTRAVENOUS
  Filled 2023-07-03: qty 1

## 2023-07-03 MED ORDER — SODIUM CHLORIDE 0.9 % IV BOLUS
1000.0000 mL | Freq: Once | INTRAVENOUS | Status: AC
  Administered 2023-07-03: 1000 mL via INTRAVENOUS

## 2023-07-03 MED ORDER — IOHEXOL 350 MG/ML SOLN
100.0000 mL | Freq: Once | INTRAVENOUS | Status: AC | PRN
  Administered 2023-07-03: 75 mL via INTRAVENOUS

## 2023-07-03 MED ORDER — LIDOCAINE 5 % EX PTCH
1.0000 | MEDICATED_PATCH | CUTANEOUS | Status: DC
Start: 2023-07-03 — End: 2023-07-03
  Administered 2023-07-03: 1 via TRANSDERMAL
  Filled 2023-07-03: qty 1

## 2023-07-03 NOTE — ED Triage Notes (Addendum)
 Shortness of breath beginning Thursday. Suspects she had a virus as she had body aches, chills, and subjective fevers.   Since then back pain on inspiration, unable to lay flat, and right sided neck pain.

## 2023-07-03 NOTE — ED Provider Notes (Signed)
  Physical Exam  BP (!) 110/98   Pulse 87   Temp 98.2 F (36.8 C) (Oral)   Resp 19   Ht 5\' 3"  (1.6 m)   Wt 83.9 kg   LMP 04/12/2003 (Approximate)   SpO2 93%   BMI 32.77 kg/m   Physical Exam  Procedures  Procedures  ED Course / MDM   Clinical Course as of 07/03/23 0943  Mon Jul 03, 2023  0855 Hgb urine dipstickMarland Kitchen): MODERATE [JL]  0855 Total Bilirubin(!): 1.5 [JL]  0855 Indirect Bilirubin(!): 1.3 [JL]    Clinical Course User Index [JL] Ernie Avena, MD   Medical Decision Making Amount and/or Complexity of Data Reviewed Labs: ordered. Decision-making details documented in ED Course. Radiology: ordered.  Risk Prescription drug management.   53F, presenting with pleuritic chest pain, generalized body aches, seems like viral syndrome, dimer elevated, CTA PE pending.    CTA PE: IMPRESSION:  1. No evidence of arterial embolism or other acute chest CT  findings.  2. Mild cardiomegaly with trace 2 vessel coronary artery  calcifications.  3. Aortic atherosclerosis.  4. Mild hepatic steatosis.  5. Osteopenia, thoracic kyphosis and advanced degenerative change.       Labs: COVID, flu, RSV PCR testing negative, initial cardiac troponin 2, repeat troponin 3, BMP unremarkable, CBC without a leukocytosis or anemia.  Pt reassessed, endorsing musculoskeletal flank pain.  Will obtain urinalysis to evaluate for hematuria or UTI.  Will administer Flexeril and a lidocaine patch to the right flank where the patient is having discomfort.  Suspect likely musculoskeletal back pain.  CT stone study: IMPRESSION:  1. No acute findings.  2. Colonic diverticulosis.  3.  Aortic Atherosclerosis (ICD10-I70.0).    Urinalysis revealed hematuria, negative for UTI.  Patient overall well-appearing, sx improved,  informed of reassuring diagnostic workup, stable for discharge at this time.   Ernie Avena, MD 07/03/23 [phone]

## 2023-07-03 NOTE — ED Provider Notes (Signed)
 [place] EMERGENCY DEPARTMENT AT [facility] Provider Note  CSN: [id number] Arrival date & time: 07/03/23 8469  Chief Complaint(s) Shortness of Breath  HPI Holly Beard is a 68 y.o. female with past medical history as below, significant for rheumatic fever as a child, hypertension, hyperlipidemia, Graves' disease who presents to the ED with complaint of bodyaches, chest pain, dyspnea  Feeling well for the past 3 days, body aches, coughing, chills, difficulty breathing.  Symptoms are gradually worsened over the past few days.  Difficulty sleeping secondary to body aches.  Having joint pain, occasional coughing nonproductive.  Pain to right side chest wall worsened with deep inspiration.  She is having some right-sided neck pain as well worsened with turning her head, feels like she has a muscle spasm in her neck.  Tried analgesics at home including muscle relaxer and hydrocodone which did not provide much relief of her symptoms.  Did recently travel out of state to care for sick parent.  Parent recently passed away earlier this month  Past Medical History Past Medical History:  Diagnosis Date   Abnormal Pap smear of cervix    yrs ago   Arthritis    Diverticulitis    Family history of adverse reaction to anesthesia    sister- gets wild    Graves disease    in remission    Graves' disease without crisis 1997   remission   History of diverticulitis    History of rheumatic fever age 32   Hyperlipidemia    Hypertension    Hypothyroidism    Pneumonia    hx  of years ago    Shingles 2021   Patient Active Problem List   Diagnosis Date Noted   Primary localized osteoarthritis of right hip 08/21/2018   Primary osteoarthritis of right hip 08/21/2018   Abnormal sense of taste 04/17/2018   Perennial allergic rhinitis 04/17/2018   Essential hypertension 11/22/2013    Class: History of   Other and unspecified hyperlipidemia 11/22/2013    Class: History of   Home  Medication(s) Prior to Admission medications   Medication Sig Start Date End Date Taking? Authorizing Provider  amLODipine (NORVASC) 5 MG tablet Take 5 mg by mouth daily.    [provider]  cetirizine (ZYRTEC) 10 MG tablet Take 10 mg by mouth at bedtime.     [provider]  cyclobenzaprine (FLEXERIL) 10 MG tablet Take 10 mg by mouth 3 (three) times daily as needed for muscle spasms. 02/03/20   [provider]  diclofenac Sodium (VOLTAREN) 1 % GEL Apply 2 g topically at bedtime.    Shirlean Mylar, MD  HYDROcodone-acetaminophen (NORCO/VICODIN) 5-325 MG tablet Take 1 tablet by mouth every 6 (six) hours as needed for moderate pain.    [provider]  Iron-Vitamin C (VITRON-C) 65-125 MG TABS Take 1 tablet by mouth daily.    [provider]  levothyroxine (SYNTHROID, LEVOTHROID) 25 MCG tablet Take 25 mcg by mouth daily before breakfast.  12/16/17   [provider]  losartan (COZAAR) 100 MG tablet Take 100 mg by mouth daily.  12/16/17   [provider]  Melatonin 10 MG TABS Take 10 mg by mouth at bedtime.    [provider]  Menthol, Topical Analgesic, (BIOFREEZE EX) Apply 1 application topically daily as needed (hip pain).     [provider]  metoprolol succinate (TOPROL-XL) 100 MG 24 hr tablet Take 100 mg by mouth daily.    [provider]  metroNIDAZOLE (METROGEL) 0.75 % gel Apply 1 application topically daily as needed (rosacea).  09/22/17   [provider]  montelukast (SINGULAIR) 10 MG tablet Take 10 mg by mouth at bedtime.  11/14/13   [provider]  omeprazole (PRILOSEC) 40 MG capsule 40 mg daily. 06/14/21   [provider]  simvastatin (ZOCOR) 20 MG tablet Take 20 mg by mouth at bedtime.  11/14/13   [provider]  SYSTANE ULTRA 0.4-0.3 % SOLN Place 1 drop into both eyes daily as needed (Dry eyes). 06/14/21   [provider]                                                                                                                                     Past Surgical History Past Surgical History:  Procedure Laterality Date   BICEPS TENDON REPAIR Left    Left shoulder   CERVICAL CONE BIOPSY     yrs ago   COCCYX REMOVAL     sledding accident   KNEE RECONSTRUCTION Right    x 2   NASAL SEPTUM SURGERY     TOTAL HIP ARTHROPLASTY Right 08/21/2018   Procedure: TOTAL HIP ARTHROPLASTY ANTERIOR APPROACH;  Surgeon: Marcene Corning, MD;  Location: [facility];  Service: Orthopedics;  Laterality: Right;   TOTAL KNEE ARTHROPLASTY Right 2007   TOTAL SHOULDER ARTHROPLASTY Left 12/16/2021   Procedure: TOTAL SHOULDER ARTHROPLASTY;  Surgeon: Jones Broom, MD;  Location: [facility];  Service: Orthopedics;  Laterality: Left;  recieved block    Family History Family History  Problem Relation Age of Onset   Hyperlipidemia Mother    Hypertension Mother    Hypertension Father    Hyperlipidemia Father    Diabetes Sister    Hypertension Sister    Hyperlipidemia Sister    Hypertension Brother    Stroke Maternal Grandmother 4   Heart attack Paternal Grandmother    Breast cancer Neg Hx     Social History Social History   Tobacco Use   Smoking status: Former    Current packs/day: 0.00    Types: Cigarettes    Quit date: 2010    Years since quitting: 15.2   Smokeless tobacco: Never  Vaping Use   Vaping status: Never Used  Substance Use Topics   Alcohol use: Yes    Comment: 1-6 drinks/week   Drug use: No   Allergies Ace inhibitors, Morphine and codeine, and Sulfa antibiotics  Review of Systems A thorough review of systems was obtained and all systems are negative except as noted in the HPI and PMH.   Physical Exam Vital Signs  I have reviewed the triage vital signs BP (!) 140/73   Pulse 91   Temp 98.2 F (36.8 C) (Oral)   Resp 17   Ht 5\' 3"  (1.6 m)   Wt 83.9 kg   LMP 04/12/2003 (Approximate)   SpO2 93%   BMI 32.77 kg/m  Physical  Exam Vitals and nursing  note reviewed.  Constitutional:      General: She is not in acute distress.    Appearance: Normal appearance.  HENT:     Head: Normocephalic and atraumatic.     Right Ear: External ear normal.     Left Ear: External ear normal.     Nose: Nose normal.     Mouth/Throat:     Mouth: Mucous membranes are moist.  Eyes:     General: No scleral icterus.       Right eye: No discharge.        Left eye: No discharge.  Neck:     Vascular: No carotid bruit.     Trachea: Trachea and phonation normal. No tracheal deviation.     Comments: TTP along right sided SCM Cardiovascular:     Rate and Rhythm: Normal rate and regular rhythm.     Pulses: Normal pulses.     Heart sounds: Normal heart sounds.  Pulmonary:     Effort: Pulmonary effort is normal. No respiratory distress.     Breath sounds: Normal breath sounds. No stridor.  Abdominal:     General: Abdomen is flat. There is no distension.     Palpations: Abdomen is soft.     Tenderness: There is no abdominal tenderness.  Musculoskeletal:     Cervical back: No rigidity. Pain with movement and muscular tenderness present. No spinous process tenderness.     Right lower leg: No edema.     Left lower leg: No edema.  Skin:    General: Skin is warm and dry.     Capillary Refill: Capillary refill takes less than 2 seconds.  Neurological:     Mental Status: She is alert.  Psychiatric:        Mood and Affect: Mood normal.        Behavior: Behavior normal. Behavior is cooperative.     ED Results and Treatments Labs (all labs ordered are listed, but only abnormal results are displayed) Labs Reviewed  BASIC METABOLIC PANEL - Abnormal; Notable for the following components:      Result Value   Glucose, Bld 113 (*)    All other components within normal limits  RESP PANEL BY RT-PCR (RSV, FLU A&B, COVID)  RVPGX2  CBC WITH DIFFERENTIAL/PLATELET  BRAIN NATRIURETIC PEPTIDE  D-DIMER, QUANTITATIVE  TROPONIN I (HIGH SENSITIVITY)                                                                                                                           Radiology DG Chest Port 1 View Result Date: 07/03/2023 CLINICAL DATA:  10026.  Shortness of breath onset 3 days ago. EXAM: PORTABLE CHEST 1 VIEW COMPARISON:  PA Lat chest 12/09/2021 FINDINGS: Slightly low inspiration on exam.  No focal pneumonia is evident. The sulci are sharp. The cardiomediastinal silhouette and vascular markings are normal. Mild thoracic dextroscoliosis and multilevel degenerative change. New demonstration left shoulder arthroplasty. IMPRESSION: No  evidence of acute chest disease. Slightly low inspiration. Electronically Signed   By: Almira Bar M.D.   On: 07/03/2023 05:12    Pertinent labs & imaging results that were available during my care of the patient were reviewed by me and considered in my medical decision making (see MDM for details).  Medications Ordered in ED Medications  ketorolac (TORADOL) 15 MG/ML injection 15 mg (15 mg Intravenous Given 07/03/23 0559)  diazepam (VALIUM) injection 5 mg (5 mg Intravenous Given 07/03/23 0558)  sodium chloride 0.9 % bolus 1,000 mL (1,000 mLs Intravenous New Bag/Given 07/03/23 0558)                                                                                                                                     Procedures Procedures  (including critical care time)  Medical Decision Making / ED Course    Medical Decision Making:    Amsi Grimley is a 68 y.o. female with past medical history as below, significant for rheumatic fever as a child, hypertension, hyperlipidemia, Graves' disease who presents to the ED with complaint of bodyaches, chest pain, dyspnea. The complaint involves an extensive differential diagnosis and also carries with it a high risk of complications and morbidity.  Serious etiology was considered. Ddx includes but is not limited to: In my evaluation of this patient's dyspnea my DDx includes, but is not  limited to, pneumonia, pulmonary embolism, pneumothorax, pulmonary edema, metabolic acidosis, asthma, COPD, cardiac cause, anemia, anxiety, etc.  Differential includes all life-threatening causes for chest pain. This includes but is not exclusive to acute coronary syndrome, aortic dissection, pulmonary embolism, cardiac tamponade, community-acquired pneumonia, pericarditis, musculoskeletal chest wall pain, etc.   Complete initial physical exam performed, notably the patient was in no acute distress, no hypoxia, lungs clear.    Reviewed and confirmed nursing documentation for past medical history, family history, social history.  Vital signs reviewed.      Clinical Course as of 07/04/23 0423  Mon Jul 03, 2023  0855 Hgb urine dipstickMarland Kitchen): MODERATE [JL]  0855 Total Bilirubin(!): 1.5 [JL]  0855 Indirect Bilirubin(!): 1.3 [JL]    Clinical Course User Index [JL] Ernie Avena, MD    Brief summary: 68 year old female history as above here with bodyaches, chest pain, chills, dyspnea, progressively worsening for the past 3 days.  Recent travel out of state.  No history of DVT or PE.  Exam is reassuring, does have apparent spasm to the right side SCM.  Lungs clear bilateral.  Abdomen benign.  Low risk Wells score, D-dimer is elevated, will get CT PE.  Trop neg, ACS unlikely. Given Toradol, Valium, IV fluids which did provide some relief of her symptoms.   Handoff to Dr Karene Fry pending CT, workup thus far is stable.              Additional history obtained: -Additional history obtained from na -External records from outside source obtained  and reviewed including: Chart review including previous notes, labs, imaging, consultation notes including  Primary care documentation, home medications   Lab Tests: -I ordered, reviewed, and interpreted labs.   The pertinent results include:   Labs Reviewed  BASIC METABOLIC PANEL - Abnormal; Notable for the following components:      Result  Value   Glucose, Bld 113 (*)    All other components within normal limits  RESP PANEL BY RT-PCR (RSV, FLU A&B, COVID)  RVPGX2  CBC WITH DIFFERENTIAL/PLATELET  BRAIN NATRIURETIC PEPTIDE  D-DIMER, QUANTITATIVE  TROPONIN I (HIGH SENSITIVITY)    Notable for D-dimer elevated  EKG   EKG Interpretation Date/Time:  Monday July 03 2023 04:37:13 EDT Ventricular Rate:  96 PR Interval:  138 QRS Duration:  87 QT Interval:  338 QTC Calculation: 428 R Axis:   -28  Text Interpretation: Sinus rhythm Borderline left axis deviation no stemi similar to prior Confirmed by Tanda Rockers ([id number]) on 07/03/2023 4:53:03 AM         Imaging Studies ordered: I ordered imaging studies including chest x-ray, CT PE I independently visualized the following imaging with scope of interpretation limited to determining acute life threatening conditions related to emergency care; findings noted above I independently visualized and interpreted imaging. I agree with the radiologist interpretation   Medicines ordered and prescription drug management: Meds ordered this encounter  Medications   ketorolac (TORADOL) 15 MG/ML injection 15 mg   diazepam (VALIUM) injection 5 mg   sodium chloride 0.9 % bolus 1,000 mL    -I have reviewed the patients home medicines and have made adjustments as needed   Consultations Obtained: Not applicable  Cardiac Monitoring: The patient was maintained on a cardiac monitor.  I personally viewed and interpreted the cardiac monitored which showed an underlying rhythm of: NSR Continuous pulse oximetry interpreted by myself, 96% on RA.    Social Determinants of Health:  Diagnosis or treatment significantly limited by social determinants of health: former smoker   Reevaluation: After the interventions noted above, I reevaluated the patient and found that they have improved  Co morbidities that complicate the patient evaluation  Past Medical History:  Diagnosis Date    Abnormal Pap smear of cervix    yrs ago   Arthritis    Diverticulitis    Family history of adverse reaction to anesthesia    sister- gets wild    Graves disease    in remission    Graves' disease without crisis 1997   remission   History of diverticulitis    History of rheumatic fever age 31   Hyperlipidemia    Hypertension    Hypothyroidism    Pneumonia    hx  of years ago    Shingles 2021      Dispostion: Disposition decision including need for hospitalization was considered, and patient disposition pending at time of sign out.    Final Clinical Impression(s) / ED Diagnoses Final diagnoses:  None        Sloan Leiter, DO 07/04/23 0424

## 2023-07-03 NOTE — Discharge Instructions (Addendum)
 Your workup in the emergency department to include EKG, labs and CT imaging was overall reassuring.  Your back pain is likely due to musculoskeletal strain, recommend lidocaine patch and NSAIDs for pain control.  Follow-up with your PCP.  A muscle relaxant has been prescribed for muscle spasm, do not operate heavy machinery while taking the medication.

## 2023-07-04 DIAGNOSIS — M9904 Segmental and somatic dysfunction of sacral region: Secondary | ICD-10-CM | POA: Diagnosis not present

## 2023-07-04 DIAGNOSIS — M9903 Segmental and somatic dysfunction of lumbar region: Secondary | ICD-10-CM | POA: Diagnosis not present

## 2023-07-04 DIAGNOSIS — M5136 Other intervertebral disc degeneration, lumbar region with discogenic back pain only: Secondary | ICD-10-CM | POA: Diagnosis not present

## 2023-07-04 DIAGNOSIS — M9905 Segmental and somatic dysfunction of pelvic region: Secondary | ICD-10-CM | POA: Diagnosis not present

## 2023-07-05 DIAGNOSIS — J3489 Other specified disorders of nose and nasal sinuses: Secondary | ICD-10-CM | POA: Diagnosis not present

## 2023-07-05 DIAGNOSIS — M5136 Other intervertebral disc degeneration, lumbar region with discogenic back pain only: Secondary | ICD-10-CM | POA: Diagnosis not present

## 2023-07-05 DIAGNOSIS — M9904 Segmental and somatic dysfunction of sacral region: Secondary | ICD-10-CM | POA: Diagnosis not present

## 2023-07-05 DIAGNOSIS — M9905 Segmental and somatic dysfunction of pelvic region: Secondary | ICD-10-CM | POA: Diagnosis not present

## 2023-07-05 DIAGNOSIS — M9903 Segmental and somatic dysfunction of lumbar region: Secondary | ICD-10-CM | POA: Diagnosis not present

## 2023-07-05 DIAGNOSIS — M1990 Unspecified osteoarthritis, unspecified site: Secondary | ICD-10-CM | POA: Diagnosis not present

## 2023-07-05 DIAGNOSIS — M436 Torticollis: Secondary | ICD-10-CM | POA: Diagnosis not present

## 2023-07-06 DIAGNOSIS — M9904 Segmental and somatic dysfunction of sacral region: Secondary | ICD-10-CM | POA: Diagnosis not present

## 2023-07-06 DIAGNOSIS — M5136 Other intervertebral disc degeneration, lumbar region with discogenic back pain only: Secondary | ICD-10-CM | POA: Diagnosis not present

## 2023-07-06 DIAGNOSIS — M9905 Segmental and somatic dysfunction of pelvic region: Secondary | ICD-10-CM | POA: Diagnosis not present

## 2023-07-06 DIAGNOSIS — M9903 Segmental and somatic dysfunction of lumbar region: Secondary | ICD-10-CM | POA: Diagnosis not present

## 2023-07-11 NOTE — Progress Notes (Signed)
 68 y.o. G0P0000 Single Caucasian female here for breast and pelvic exam.    Denies vaginal bleeding. Some urinary incontinence with coughing and respiratory symptoms.   Using coconut oil for vaginal hydration.   Occasional right upper quadrant when she bends over.  CT scan showed no acute findings.  She started taking Metamucil.   Took care of her father who recently passed away.  Retired Physicist, medical.  Has a therapist and support of friends.   PCP: Bufford Carne, FNP   Patient's last menstrual period was 04/12/2003 (approximate).           Sexually active: No.  The current method of family planning is post menopausal status.    Menopausal hormone therapy:  n/a Exercising: Yes.     Walking 2-3x a week Smoker:  former  OB History  Gravida Para Term Preterm AB Living  0 0 0 0 0 0  SAB IAB Ectopic Multiple Live Births  0 0 0 0      HEALTH MAINTENANCE: Last 2 paps:  06/17/21 neg: HR HPV neg, 12/22/17 neg: HR HPV neg History of abnormal Pap or positive HPV:  yes,  years ago--had colposcopy and repeat pap--no treatment  Mammogram:   07/06/22 Breast Density Cat B, BI-RADS CAT 1 neg Colonoscopy:  8-9 years ago per pt Bone Density:  10/27/22  Result  normal   Immunization History  Administered Date(s) Administered   Influenza-Unspecified 12/27/2016, 12/21/2018   Moderna Sars-Covid-2 Vaccination 04/10/2019, 05/08/2019, 02/03/2020   Tdap 04/11/2008, 01/15/2019      reports that she quit smoking about 15 years ago. Her smoking use included cigarettes. She has never used smokeless tobacco. She reports current alcohol use. She reports that she does not use drugs.  Past Medical History:  Diagnosis Date   Abnormal Pap smear of cervix    yrs ago   Arthritis    Diverticulitis    Family history of adverse reaction to anesthesia    sister- gets wild    Graves disease    in remission    Graves' disease without crisis 1997   remission   History of diverticulitis     History of rheumatic fever age 38   Hyperlipidemia    Hypertension    Hypothyroidism    Pneumonia    hx  of years ago    Shingles 2021    Past Surgical History:  Procedure Laterality Date   BICEPS TENDON REPAIR Left    Left shoulder   CERVICAL CONE BIOPSY     yrs ago   COCCYX REMOVAL     sledding accident   KNEE RECONSTRUCTION Right    x 2   NASAL SEPTUM SURGERY     TOTAL HIP ARTHROPLASTY Right 08/21/2018   Procedure: TOTAL HIP ARTHROPLASTY ANTERIOR APPROACH;  Surgeon: Dayne Even, MD;  Location: [facility];  Service: Orthopedics;  Laterality: Right;   TOTAL KNEE ARTHROPLASTY Right 2007   TOTAL SHOULDER ARTHROPLASTY Left 12/16/2021   Procedure: TOTAL SHOULDER ARTHROPLASTY;  Surgeon: Sammye Cristal, MD;  Location: [facility];  Service: Orthopedics;  Laterality: Left;  recieved block     Current Outpatient Medications  Medication Sig Dispense Refill   amLODipine (NORVASC) 5 MG tablet Take 5 mg by mouth daily.     cetirizine (ZYRTEC) 10 MG tablet Take 10 mg by mouth at bedtime.      diclofenac Sodium (VOLTAREN) 1 % GEL Apply 2 g topically at bedtime.     HYDROcodone-acetaminophen (NORCO/VICODIN) 5-325 MG  tablet Take 1 tablet by mouth every 6 (six) hours as needed for moderate pain.     Iron-Vitamin C (VITRON-C) 65-125 MG TABS Take 1 tablet by mouth daily.     levothyroxine (SYNTHROID, LEVOTHROID) 25 MCG tablet Take 25 mcg by mouth daily before breakfast.   11   lidocaine (LIDODERM) 5 % Place 1 patch onto the skin daily. Remove & Discard patch within 12 hours or as directed by MD 30 patch 0   losartan (COZAAR) 100 MG tablet Take 100 mg by mouth daily.   3   Melatonin 10 MG TABS Take 10 mg by mouth at bedtime.     Menthol, Topical Analgesic, (BIOFREEZE EX) Apply 1 application topically daily as needed (hip pain).      metoprolol succinate (TOPROL-XL) 100 MG 24 hr tablet Take 100 mg by mouth daily.     metroNIDAZOLE (METROGEL) 0.75 % gel Apply 1 application topically daily as needed  (rosacea).   11   montelukast (SINGULAIR) 10 MG tablet Take 10 mg by mouth at bedtime.      omeprazole (PRILOSEC) 40 MG capsule 40 mg daily.     simvastatin (ZOCOR) 20 MG tablet Take 20 mg by mouth at bedtime.      SYSTANE ULTRA 0.4-0.3 % SOLN Place 1 drop into both eyes daily as needed (Dry eyes).     tiZANidine (ZANAFLEX) 4 MG tablet Take 1 tablet (4 mg total) by mouth every 6 (six) hours as needed for muscle spasms. 30 tablet 0   No current facility-administered medications for this visit.    ALLERGIES: Ace inhibitors, Morphine and codeine, and Sulfa antibiotics  Family History  Problem Relation Age of Onset   Hyperlipidemia Mother    Hypertension Mother    Hypertension Father    Hyperlipidemia Father    Diabetes Sister    Hypertension Sister    Hyperlipidemia Sister    Hypertension Brother    Stroke Maternal Grandmother 34   Heart attack Paternal Grandmother    Breast cancer Neg Hx     Review of Systems  All other systems reviewed and are negative.   PHYSICAL EXAM:  BP 128/84 (BP Location: Right Arm, Patient Position: Sitting, Cuff Size: Small)   Pulse 64   Ht 5\' 2"  (1.575 m)   Wt 184 lb (83.5 kg)   LMP 04/12/2003 (Approximate)   SpO2 98%   BMI 33.65 kg/m     General appearance: alert, cooperative and appears stated age Head: normocephalic, without obvious abnormality, atraumatic Neck: no adenopathy, supple, symmetrical, trachea midline and thyroid normal to inspection and palpation Lungs: clear to auscultation bilaterally Breasts: normal appearance, no masses or tenderness, No nipple retraction or dimpling, No nipple discharge or bleeding, No axillary adenopathy Heart: regular rate and rhythm Abdomen: soft, non-tender; no masses, no organomegaly Extremities: extremities normal, atraumatic, no cyanosis or edema Skin: skin color, texture, turgor normal. No rashes or lesions Lymph nodes: cervical, supraclavicular, and axillary nodes normal. Neurologic: grossly  normal  Pelvic: External genitalia:  no lesions              No abnormal inguinal nodes palpated.              Urethra:  normal appearing urethra with no masses, tenderness or lesions              Bartholins and Skenes: normal                 Vagina: normal appearing vagina with normal  color and discharge, no lesions              Cervix: no lesions              Pap taken: No. Bimanual Exam:  Uterus:  normal size, contour, position, consistency, mobility, non-tender              Adnexa: no mass, fullness, tenderness              Rectal exam: Yes.  .  Confirms.              Anus:  normal sphincter tone, no lesions  Chaperone was present for exam:  Emmaline Haring, CMA  ASSESSMENT: Encounter for breast and pelvic exam.  Right upper quadrant discomfort.  Hx of cervical cone biopsy in her 30s or 30s.   PLAN: Mammogram screening discussed.  She will schedule.  Self breast awareness reviewed. Pap and HRV collected:  no.  Due in 2028.  Guidelines for Calcium, Vitamin D, regular exercise program including cardiovascular and weight bearing exercise. Medication refills:  NA Labs with PCP.  Follow up:  2 years and prn.

## 2023-07-25 ENCOUNTER — Ambulatory Visit: Payer: Medicare Other | Admitting: Obstetrics and Gynecology

## 2023-07-25 ENCOUNTER — Other Ambulatory Visit: Payer: Self-pay | Admitting: Family Medicine

## 2023-07-25 ENCOUNTER — Encounter: Payer: Self-pay | Admitting: Obstetrics and Gynecology

## 2023-07-25 VITALS — BP 128/84 | HR 64 | Ht 62.0 in | Wt 184.0 lb

## 2023-07-25 DIAGNOSIS — Z01419 Encounter for gynecological examination (general) (routine) without abnormal findings: Secondary | ICD-10-CM

## 2023-07-25 DIAGNOSIS — Z1231 Encounter for screening mammogram for malignant neoplasm of breast: Secondary | ICD-10-CM

## 2023-07-25 NOTE — Patient Instructions (Signed)

## 2023-08-08 DIAGNOSIS — M5136 Other intervertebral disc degeneration, lumbar region with discogenic back pain only: Secondary | ICD-10-CM | POA: Diagnosis not present

## 2023-08-08 DIAGNOSIS — M9905 Segmental and somatic dysfunction of pelvic region: Secondary | ICD-10-CM | POA: Diagnosis not present

## 2023-08-08 DIAGNOSIS — M9904 Segmental and somatic dysfunction of sacral region: Secondary | ICD-10-CM | POA: Diagnosis not present

## 2023-08-08 DIAGNOSIS — M9903 Segmental and somatic dysfunction of lumbar region: Secondary | ICD-10-CM | POA: Diagnosis not present

## 2023-08-09 ENCOUNTER — Ambulatory Visit
Admission: RE | Admit: 2023-08-09 | Discharge: 2023-08-09 | Disposition: A | Discharge status: Home / Self Care | Source: Ambulatory Visit | Attending: Family Medicine | Admitting: Family Medicine

## 2023-08-09 DIAGNOSIS — Z1231 Encounter for screening mammogram for malignant neoplasm of breast: Secondary | ICD-10-CM

## 2023-08-22 DIAGNOSIS — G8929 Other chronic pain: Secondary | ICD-10-CM | POA: Diagnosis not present

## 2023-08-22 DIAGNOSIS — I10 Essential (primary) hypertension: Secondary | ICD-10-CM | POA: Diagnosis not present

## 2023-08-22 DIAGNOSIS — M48061 Spinal stenosis, lumbar region without neurogenic claudication: Secondary | ICD-10-CM | POA: Diagnosis not present

## 2023-08-22 DIAGNOSIS — I2510 Atherosclerotic heart disease of native coronary artery without angina pectoris: Secondary | ICD-10-CM | POA: Diagnosis not present

## 2023-08-22 DIAGNOSIS — N289 Disorder of kidney and ureter, unspecified: Secondary | ICD-10-CM | POA: Diagnosis not present

## 2023-08-22 DIAGNOSIS — M545 Low back pain, unspecified: Secondary | ICD-10-CM | POA: Diagnosis not present

## 2023-08-30 DIAGNOSIS — M9905 Segmental and somatic dysfunction of pelvic region: Secondary | ICD-10-CM | POA: Diagnosis not present

## 2023-08-30 DIAGNOSIS — M5136 Other intervertebral disc degeneration, lumbar region with discogenic back pain only: Secondary | ICD-10-CM | POA: Diagnosis not present

## 2023-08-30 DIAGNOSIS — M9903 Segmental and somatic dysfunction of lumbar region: Secondary | ICD-10-CM | POA: Diagnosis not present

## 2023-08-30 DIAGNOSIS — M9904 Segmental and somatic dysfunction of sacral region: Secondary | ICD-10-CM | POA: Diagnosis not present

## 2023-09-05 DIAGNOSIS — M5136 Other intervertebral disc degeneration, lumbar region with discogenic back pain only: Secondary | ICD-10-CM | POA: Diagnosis not present

## 2023-09-05 DIAGNOSIS — M9905 Segmental and somatic dysfunction of pelvic region: Secondary | ICD-10-CM | POA: Diagnosis not present

## 2023-09-05 DIAGNOSIS — M9903 Segmental and somatic dysfunction of lumbar region: Secondary | ICD-10-CM | POA: Diagnosis not present

## 2023-09-05 DIAGNOSIS — M9904 Segmental and somatic dysfunction of sacral region: Secondary | ICD-10-CM | POA: Diagnosis not present

## 2023-09-07 DIAGNOSIS — M9905 Segmental and somatic dysfunction of pelvic region: Secondary | ICD-10-CM | POA: Diagnosis not present

## 2023-09-07 DIAGNOSIS — M9903 Segmental and somatic dysfunction of lumbar region: Secondary | ICD-10-CM | POA: Diagnosis not present

## 2023-09-07 DIAGNOSIS — M5136 Other intervertebral disc degeneration, lumbar region with discogenic back pain only: Secondary | ICD-10-CM | POA: Diagnosis not present

## 2023-09-07 DIAGNOSIS — M9904 Segmental and somatic dysfunction of sacral region: Secondary | ICD-10-CM | POA: Diagnosis not present

## 2023-09-12 DIAGNOSIS — M5136 Other intervertebral disc degeneration, lumbar region with discogenic back pain only: Secondary | ICD-10-CM | POA: Diagnosis not present

## 2023-09-12 DIAGNOSIS — M9903 Segmental and somatic dysfunction of lumbar region: Secondary | ICD-10-CM | POA: Diagnosis not present

## 2023-09-12 DIAGNOSIS — M9904 Segmental and somatic dysfunction of sacral region: Secondary | ICD-10-CM | POA: Diagnosis not present

## 2023-09-12 DIAGNOSIS — M9905 Segmental and somatic dysfunction of pelvic region: Secondary | ICD-10-CM | POA: Diagnosis not present

## 2023-09-18 DIAGNOSIS — M9904 Segmental and somatic dysfunction of sacral region: Secondary | ICD-10-CM | POA: Diagnosis not present

## 2023-09-18 DIAGNOSIS — M9903 Segmental and somatic dysfunction of lumbar region: Secondary | ICD-10-CM | POA: Diagnosis not present

## 2023-09-18 DIAGNOSIS — M5136 Other intervertebral disc degeneration, lumbar region with discogenic back pain only: Secondary | ICD-10-CM | POA: Diagnosis not present

## 2023-09-18 DIAGNOSIS — M9905 Segmental and somatic dysfunction of pelvic region: Secondary | ICD-10-CM | POA: Diagnosis not present

## 2023-10-19 DIAGNOSIS — M9903 Segmental and somatic dysfunction of lumbar region: Secondary | ICD-10-CM | POA: Diagnosis not present

## 2023-10-19 DIAGNOSIS — M9905 Segmental and somatic dysfunction of pelvic region: Secondary | ICD-10-CM | POA: Diagnosis not present

## 2023-10-19 DIAGNOSIS — M9904 Segmental and somatic dysfunction of sacral region: Secondary | ICD-10-CM | POA: Diagnosis not present

## 2023-10-19 DIAGNOSIS — M5136 Other intervertebral disc degeneration, lumbar region with discogenic back pain only: Secondary | ICD-10-CM | POA: Diagnosis not present

## 2023-11-02 DIAGNOSIS — M4696 Unspecified inflammatory spondylopathy, lumbar region: Secondary | ICD-10-CM | POA: Diagnosis not present

## 2023-11-09 DIAGNOSIS — I10 Essential (primary) hypertension: Secondary | ICD-10-CM | POA: Diagnosis not present

## 2023-11-09 DIAGNOSIS — E782 Mixed hyperlipidemia: Secondary | ICD-10-CM | POA: Diagnosis not present

## 2023-11-09 DIAGNOSIS — M150 Primary generalized (osteo)arthritis: Secondary | ICD-10-CM | POA: Diagnosis not present

## 2023-11-10 DIAGNOSIS — M47816 Spondylosis without myelopathy or radiculopathy, lumbar region: Secondary | ICD-10-CM | POA: Diagnosis not present

## 2023-11-22 DIAGNOSIS — I10 Essential (primary) hypertension: Secondary | ICD-10-CM | POA: Diagnosis not present

## 2023-11-22 DIAGNOSIS — M48061 Spinal stenosis, lumbar region without neurogenic claudication: Secondary | ICD-10-CM | POA: Diagnosis not present

## 2023-11-22 DIAGNOSIS — R7303 Prediabetes: Secondary | ICD-10-CM | POA: Diagnosis not present

## 2023-11-22 DIAGNOSIS — M545 Low back pain, unspecified: Secondary | ICD-10-CM | POA: Diagnosis not present

## 2023-11-22 DIAGNOSIS — G8929 Other chronic pain: Secondary | ICD-10-CM | POA: Diagnosis not present

## 2023-11-23 DIAGNOSIS — M47816 Spondylosis without myelopathy or radiculopathy, lumbar region: Secondary | ICD-10-CM | POA: Diagnosis not present

## 2023-12-10 DIAGNOSIS — E782 Mixed hyperlipidemia: Secondary | ICD-10-CM | POA: Diagnosis not present

## 2023-12-10 DIAGNOSIS — M150 Primary generalized (osteo)arthritis: Secondary | ICD-10-CM | POA: Diagnosis not present

## 2023-12-10 DIAGNOSIS — I10 Essential (primary) hypertension: Secondary | ICD-10-CM | POA: Diagnosis not present

## 2023-12-19 IMAGING — MG MM DIGITAL SCREENING BILAT W/ TOMO AND CAD
8 series · 8 of 24 positions shown · non-contrast
Comparison: Previous exam(s).

CLINICAL DATA: Screening.

EXAM:
DIGITAL SCREENING BILATERAL MAMMOGRAM WITH TOMOSYNTHESIS AND CAD
TECHNIQUE: Bilateral screening digital craniocaudal and mediolateral oblique
mammograms were obtained. Bilateral screening digital breast
tomosynthesis was performed. The images were evaluated with
computer-aided detection.

[L MLO synth-2D]
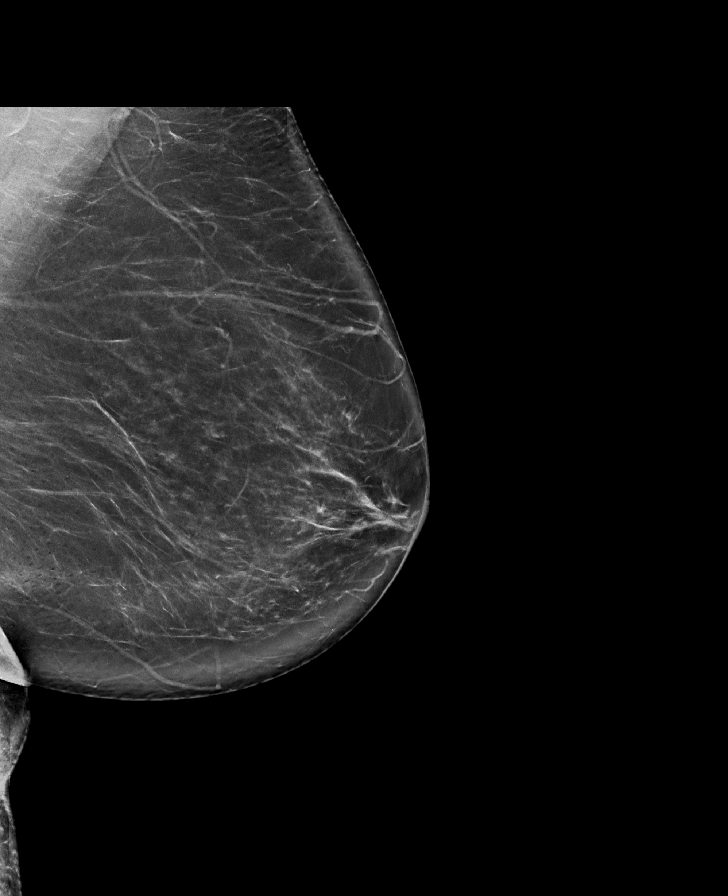

[R MLO synth-2D]
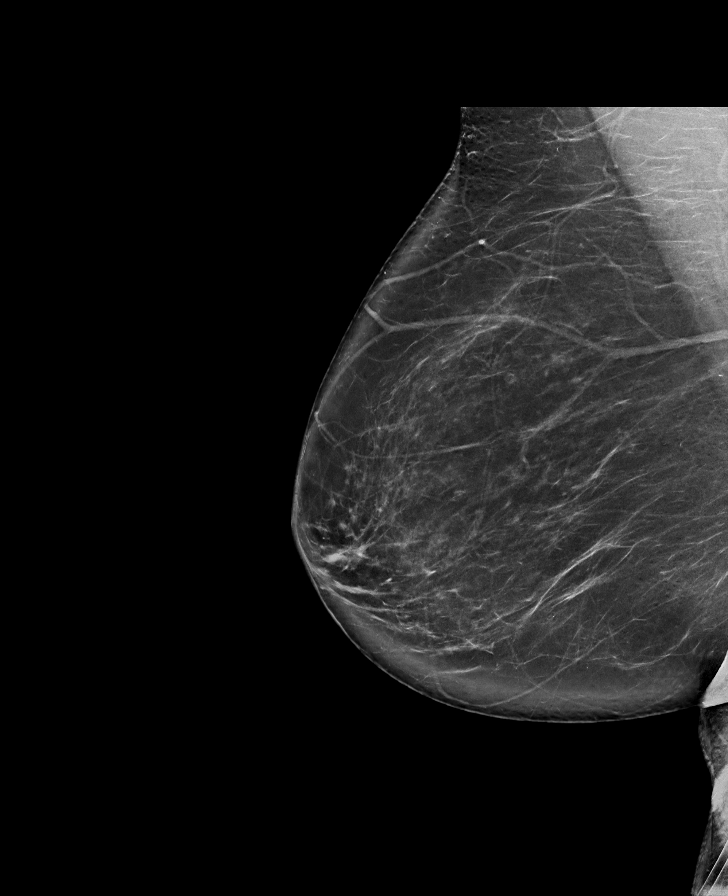

[R CC synth-2D]
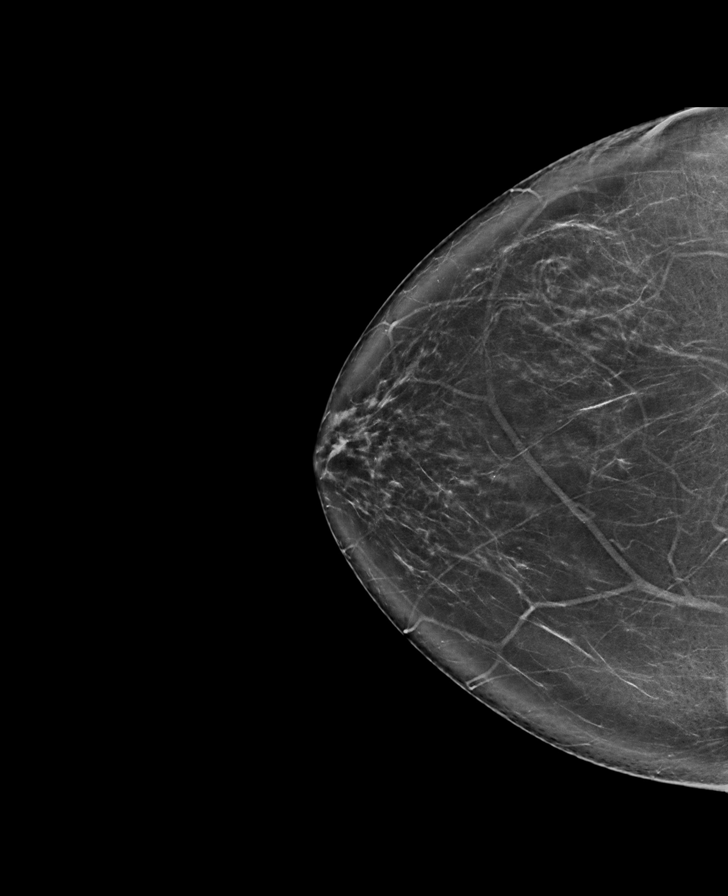

[L CC synth-2D]
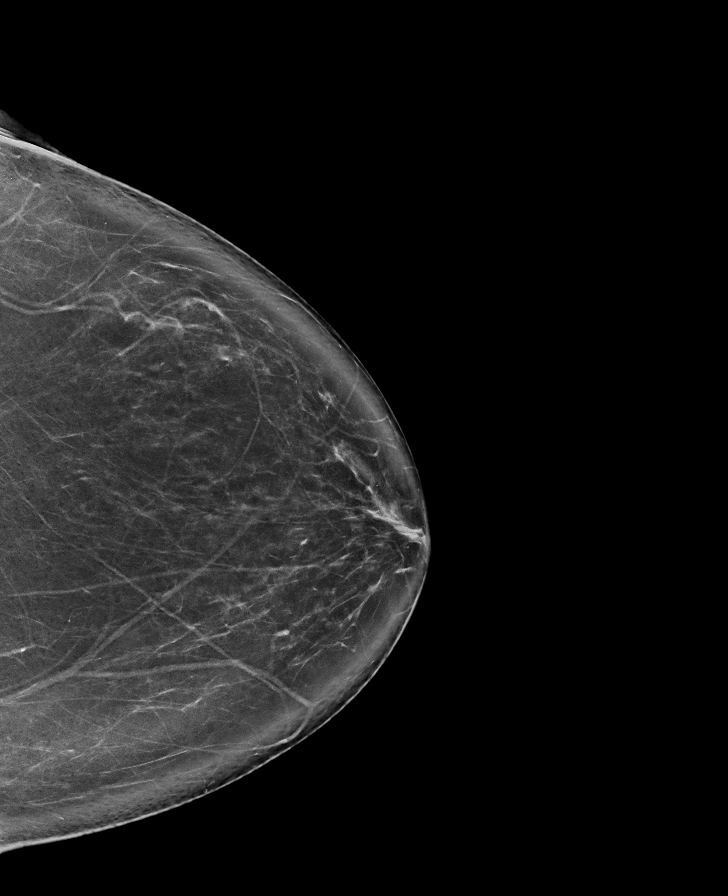

[L MLO tomo · tomo slice 49/98.0]
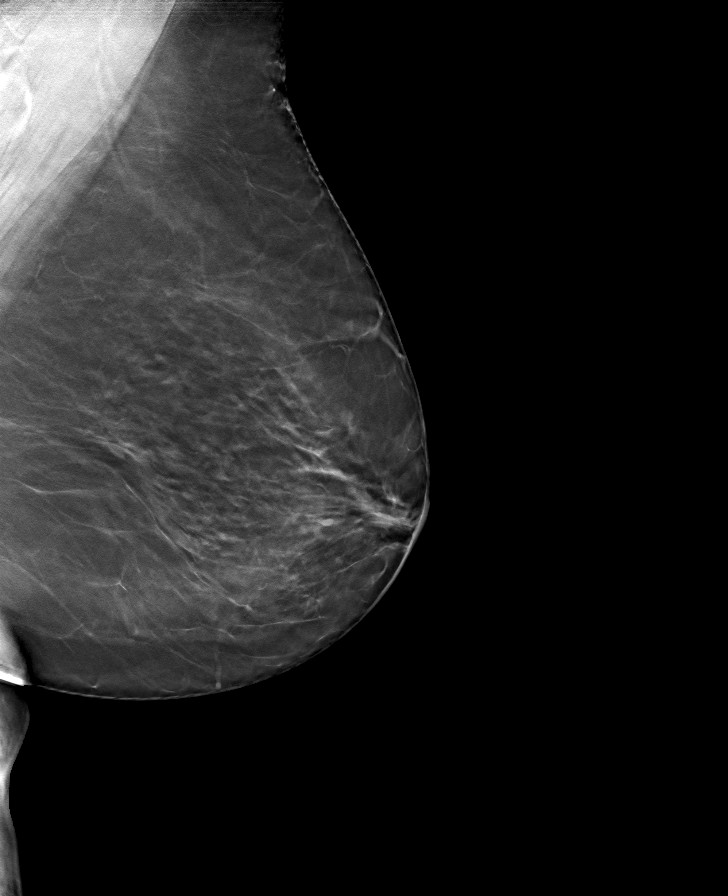

[L CC tomo · tomo slice 48/95.0]
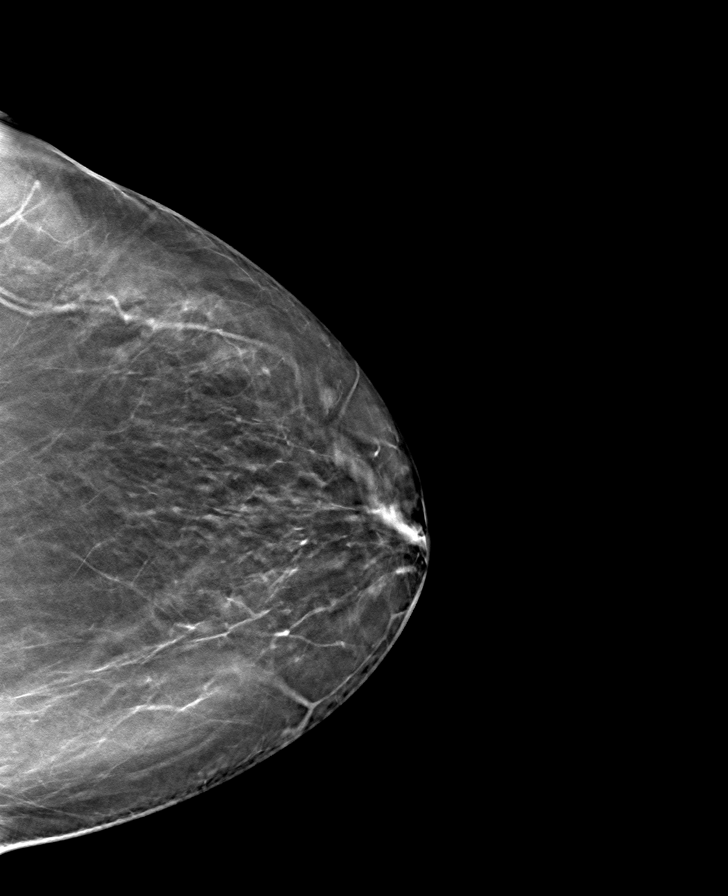

[R CC tomo · tomo slice 45/89.0]
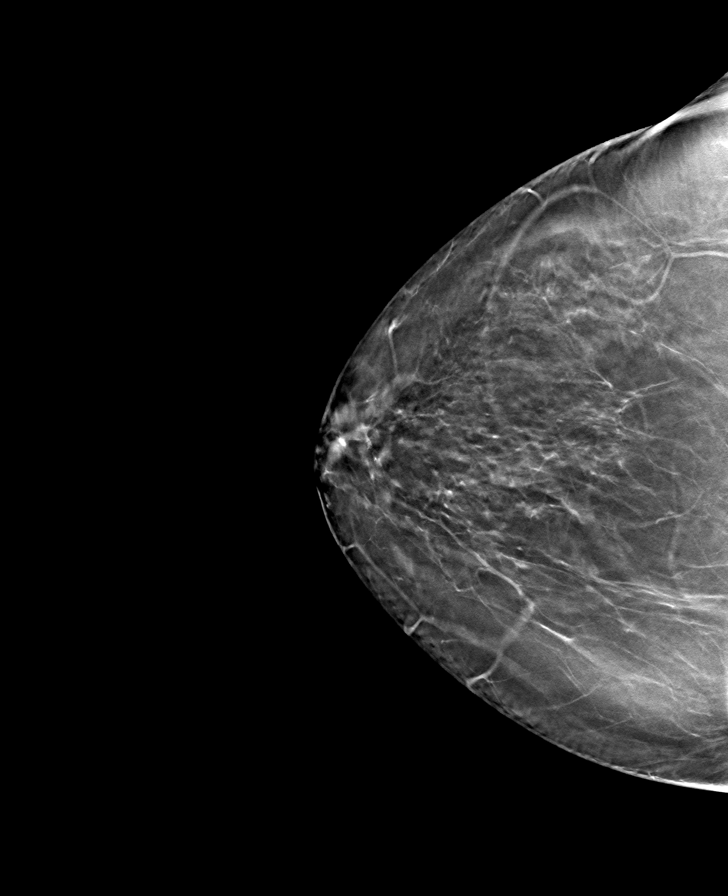

[R MLO tomo · tomo slice 48/95.0]
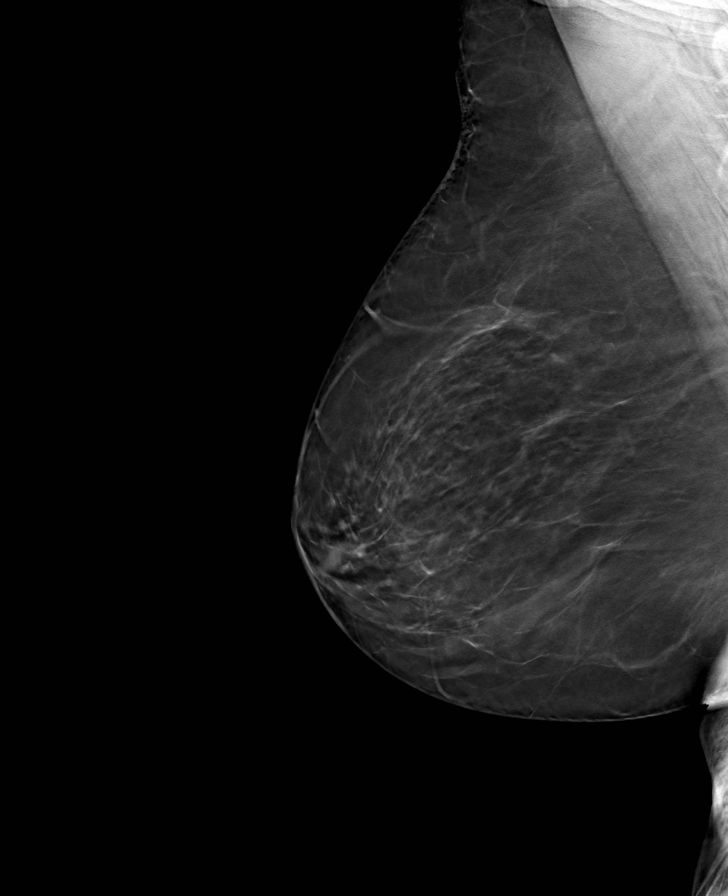

[8 of 24 positions shown; findings below may reference images not displayed]

ACR Breast Density Category b: There are scattered areas of
fibroglandular density.
FINDINGS: There are no findings suspicious for malignancy.
IMPRESSION: No mammographic evidence of malignancy. A result letter of this
screening mammogram will be mailed directly to the patient.

RECOMMENDATION:
Screening mammogram in one year. (Code:51-O-LD2)

BI-RADS CATEGORY  1: Negative.

## 2023-12-26 DIAGNOSIS — M9904 Segmental and somatic dysfunction of sacral region: Secondary | ICD-10-CM | POA: Diagnosis not present

## 2023-12-26 DIAGNOSIS — M9905 Segmental and somatic dysfunction of pelvic region: Secondary | ICD-10-CM | POA: Diagnosis not present

## 2023-12-26 DIAGNOSIS — M5136 Other intervertebral disc degeneration, lumbar region with discogenic back pain only: Secondary | ICD-10-CM | POA: Diagnosis not present

## 2023-12-26 DIAGNOSIS — M9903 Segmental and somatic dysfunction of lumbar region: Secondary | ICD-10-CM | POA: Diagnosis not present

## 2024-01-03 DIAGNOSIS — M5136 Other intervertebral disc degeneration, lumbar region with discogenic back pain only: Secondary | ICD-10-CM | POA: Diagnosis not present

## 2024-01-03 DIAGNOSIS — M9904 Segmental and somatic dysfunction of sacral region: Secondary | ICD-10-CM | POA: Diagnosis not present

## 2024-01-03 DIAGNOSIS — M9905 Segmental and somatic dysfunction of pelvic region: Secondary | ICD-10-CM | POA: Diagnosis not present

## 2024-01-03 DIAGNOSIS — M9903 Segmental and somatic dysfunction of lumbar region: Secondary | ICD-10-CM | POA: Diagnosis not present

## 2024-01-05 DIAGNOSIS — M47816 Spondylosis without myelopathy or radiculopathy, lumbar region: Secondary | ICD-10-CM | POA: Diagnosis not present

## 2024-01-09 DIAGNOSIS — M9905 Segmental and somatic dysfunction of pelvic region: Secondary | ICD-10-CM | POA: Diagnosis not present

## 2024-01-09 DIAGNOSIS — M9903 Segmental and somatic dysfunction of lumbar region: Secondary | ICD-10-CM | POA: Diagnosis not present

## 2024-01-09 DIAGNOSIS — M5136 Other intervertebral disc degeneration, lumbar region with discogenic back pain only: Secondary | ICD-10-CM | POA: Diagnosis not present

## 2024-01-09 DIAGNOSIS — M150 Primary generalized (osteo)arthritis: Secondary | ICD-10-CM | POA: Diagnosis not present

## 2024-01-09 DIAGNOSIS — I10 Essential (primary) hypertension: Secondary | ICD-10-CM | POA: Diagnosis not present

## 2024-01-09 DIAGNOSIS — E782 Mixed hyperlipidemia: Secondary | ICD-10-CM | POA: Diagnosis not present

## 2024-01-09 DIAGNOSIS — M9904 Segmental and somatic dysfunction of sacral region: Secondary | ICD-10-CM | POA: Diagnosis not present

## 2024-01-18 DIAGNOSIS — M47816 Spondylosis without myelopathy or radiculopathy, lumbar region: Secondary | ICD-10-CM | POA: Diagnosis not present

## 2024-01-23 ENCOUNTER — Encounter: Payer: Self-pay | Admitting: Cardiology

## 2024-01-23 ENCOUNTER — Ambulatory Visit: Attending: Cardiology | Admitting: Cardiology

## 2024-01-23 VITALS — BP 130/86 | HR 62 | Ht 63.0 in | Wt 185.0 lb

## 2024-01-23 DIAGNOSIS — I10 Essential (primary) hypertension: Secondary | ICD-10-CM

## 2024-01-23 DIAGNOSIS — R9431 Abnormal electrocardiogram [ECG] [EKG]: Secondary | ICD-10-CM | POA: Diagnosis not present

## 2024-01-23 DIAGNOSIS — R011 Cardiac murmur, unspecified: Secondary | ICD-10-CM | POA: Diagnosis not present

## 2024-01-23 DIAGNOSIS — Z01812 Encounter for preprocedural laboratory examination: Secondary | ICD-10-CM | POA: Diagnosis not present

## 2024-01-23 DIAGNOSIS — I341 Nonrheumatic mitral (valve) prolapse: Secondary | ICD-10-CM | POA: Diagnosis not present

## 2024-01-23 DIAGNOSIS — E782 Mixed hyperlipidemia: Secondary | ICD-10-CM

## 2024-01-23 MED ORDER — METOPROLOL TARTRATE 50 MG PO TABS: ORAL_TABLET | ORAL | 0 refills | Status: DC

## 2024-01-23 NOTE — Patient Instructions (Addendum)
 Medication Instructions:  Your physician recommends that you continue on your current medications as directed. Please refer to the Current Medication list given to you today.  *If you need a refill on your cardiac medications before your next appointment, please call your pharmacy*  Lab Work: BMET, Mag If you have labs (blood work) drawn today and your tests are completely normal, you will receive your results only by: MyChart Message (if you have MyChart) OR A paper copy in the mail If you have any lab test that is abnormal or we need to change your treatment, we will call you to review the results.  Testing/Procedures: Coronary CTA    Your cardiac CT will be scheduled at   [facility] [address]  [place], [place] [address] [phone]   If scheduled at the Heart and Vascular Tower at [facility] street, please enter the parking lot using the [address] entrance and use the FREE valet service at the patient drop-off area. Enter the building and check-in with registration on the main floor.  Please follow these instructions carefully (unless otherwise directed):  An IV will be required for this test and Nitroglycerin will be given.    On the Night Before the Test: Be sure to Drink plenty of water . Do not consume any caffeinated/decaffeinated beverages or chocolate 12 hours prior to your test. Do not take any antihistamines 12 hours prior to your test.  On the Day of the Test: Drink plenty of water  until 1 hour prior to the test. Do not eat any food 1 hour prior to test. You may take your regular medications prior to the test.  Take metoprolol  (Lopressor ) 50 mg two hours prior to test. FEMALES- please wear underwire-free bra if available, avoid dresses & tight clothing      After the Test: Drink plenty of water . After receiving IV contrast, you may experience a mild flushed feeling. This is normal. On occasion, you may experience a  mild rash up to 24 hours after the test. This is not dangerous. If this occurs, you can take Benadryl  25 mg, Zyrtec, Claritin , or Allegra and increase your fluid intake. (Patients taking Tikosyn should avoid Benadryl , and may take Zyrtec, Claritin , or Allegra) If you experience trouble breathing, this can be serious. If it is severe call 911 IMMEDIATELY. If it is mild, please call our office.  We will call to schedule your test 2-4 weeks out understanding that some insurance companies will need an authorization prior to the service being performed.   For more information and frequently asked questions, please visit our website : [email or web address]  For non-scheduling related questions, please contact the cardiac imaging nurse navigator should you have any questions/concerns: Cardiac Imaging Nurse Navigators Direct Office Dial: [phone]   For scheduling needs, including cancellations and rescheduling, please call [place], [phone].   Your physician has requested that you have an echocardiogram. Echocardiography is a painless test that uses sound waves to create images of your heart. It provides your doctor with information about the size and shape of your heart and how well your heart's chambers and valves are working. This procedure takes approximately one hour. There are no restrictions for this procedure. Please do NOT wear cologne, perfume, aftershave, or lotions (deodorant is allowed). Please arrive 15 minutes prior to your appointment time.  Please note: We ask at that you not bring children with you during ultrasound (echo/ vascular) testing. Due to room size and safety concerns, children are  not allowed in the ultrasound rooms during exams. Our front office staff cannot provide observation of children in our lobby area while testing is being conducted. An adult accompanying a patient to their appointment will only be allowed in the ultrasound room at the discretion of  the ultrasound technician under special circumstances. We apologize for any inconvenience.    Follow-Up: At [facility], you and your health needs are our priority.  As part of our continuing mission to provide you with exceptional heart care, our providers are all part of one team.  This team includes your primary Cardiologist (physician) and Advanced Practice Providers or APPs (Physician Assistants and Nurse Practitioners) who all work together to provide you with the care you need, when you need it.  Your next appointment:   16 week(s) via MyChart  Provider:   Kardie Tobb, DO

## 2024-01-23 NOTE — Progress Notes (Signed)
 Cardiology Office Note:    Date:  01/23/2024   ID:  Holly, Beard 13-Jan-1956, MRN [id number]  PCP:  Dyane Anthony RAMAN, FNP  Cardiologist:  None  Electrophysiologist:  None   Referring MD: Dyane Anthony RAMAN, FNP    I am   History of Present Illness:    Holly Beard is a 68 y.o. female with a hx of diverticulitis, Graves' disease, history of rheumatic fever, history of hypertension, hypothyroidism.  Discussed the use of AI scribe software for clinical note transcription with the patient, who gave verbal consent to proceed   She experiences occasional sharp chest pain that is intermittent and without specific triggers. A recent chest x-ray indicated an 'old infarct.' She occasionally experiences ankle swelling, which is intermittent.  She had rheumatic fever at age 9 and was on penicillin until age 1. She was previously cleared by a cardiologist in [place] from needing antibiotics for dental work. She recalls having an echocardiogram and stress tests in [place] , which did not result in any specific interventions. She was told she had a heart murmur and possibly mitral valve prolapse, though details are unclear.  Her family history includes cardiovascular issues, with a paternal grandfather who died of a heart attack at 83 and a maternal grandmother who died of a stroke at 24. Her family, including her late mother and sister, have high blood pressure and high cholesterol.   Past Medical History:  Diagnosis Date   Abnormal Pap smear of cervix    yrs ago   Arthritis    Diverticulitis    Family history of adverse reaction to anesthesia    sister- gets wild    Graves disease    in remission    Graves' disease without crisis 1997   remission   History of diverticulitis    History of rheumatic fever age 70   Hyperlipidemia    Hypertension    Hypothyroidism    Pneumonia    hx  of years ago    Shingles 2021    Past Surgical History:  Procedure Laterality Date    BICEPS TENDON REPAIR Left    Left shoulder   CERVICAL CONE BIOPSY     yrs ago   COCCYX REMOVAL     sledding accident   KNEE RECONSTRUCTION Right    x 2   NASAL SEPTUM SURGERY     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   cetirizine (ZYRTEC) 10 MG tablet Take 10 mg by mouth at bedtime.    diclofenac Sodium (VOLTAREN) 1 % GEL Apply 2 g topically at bedtime.   HYDROcodone-acetaminophen  (NORCO/VICODIN) 5-325 MG tablet Take 1 tablet by mouth every 6 (six) hours as needed for moderate pain.   levothyroxine  (SYNTHROID , LEVOTHROID) 25 MCG tablet Take 25 mcg by mouth daily before breakfast.    losartan  (COZAAR ) 100 MG tablet Take 100 mg by mouth daily.    Melatonin 10 MG TABS Take 10 mg by mouth at bedtime.   Menthol , Topical Analgesic, (  BIOFREEZE EX) Apply 1 application topically daily as needed (hip pain).    metoprolol  succinate (TOPROL -XL) 100 MG 24 hr tablet Take 100 mg by mouth daily.   metroNIDAZOLE  (METROGEL ) 0.75 % gel Apply 1 application topically daily as needed (rosacea).    montelukast  (SINGULAIR ) 10 MG tablet Take 10 mg by mouth at bedtime.    omeprazole (PRILOSEC) 40 MG capsule 40 mg daily.   simvastatin  (ZOCOR ) 20 MG tablet Take 20 mg by mouth at bedtime.    SYSTANE ULTRA 0.4-0.3 % SOLN Place 1 drop into both eyes daily as needed (Dry eyes).     Allergies:   Ace inhibitors, Morphine and codeine, and Sulfa antibiotics   Social History   Socioeconomic History   Marital status: Single     Spouse name: Not on file   Number of children: 0   Years of education: Not on file   Highest education level: Bachelor's degree (e.g., BA, AB, BS)  Occupational History   Not on file  Tobacco Use   Smoking status: Former    Current packs/day: 0.00    Types: Cigarettes    Quit date: 2010    Years since quitting: 15.7   Smokeless tobacco: Never  Vaping Use   Vaping status: Never Used  Substance and Sexual Activity   Alcohol use: Yes    Comment: 1-6 drinks/week   Drug use: No   Sexual activity: Not Currently    Partners: Female    Birth control/protection: Post-menopausal    Comment: less than 5, IC after 16, no STD, no abnormal pap, no DES  Other Topics Concern   Not on file  Social History Narrative   Not on file   Social Drivers of Health   Financial Resource Strain: Low Risk  (12/18/2019)   Overall Financial Resource Strain (CARDIA)    Difficulty of Paying Living Expenses: Not hard at all  Food Insecurity: No Food Insecurity (12/18/2019)   Hunger Vital Sign    Worried About Running Out of Food in the Last Year: Never true    Ran Out of Food in the Last Year: Never true  Transportation Needs: No Transportation Needs (12/18/2019)   PRAPARE - Administrator, Civil Service (Medical): No    Lack of Transportation (Non-Medical): No  Physical Activity: Not on file  Stress: Not on file  Social Connections: Not on file     Family History: The patient's family history includes Diabetes in her sister; Heart attack in her paternal grandmother; Hyperlipidemia in her father, mother, and sister; Hypertension in her brother, father, mother, and sister; Stroke (age of onset: 13) in her maternal grandmother. There is no history of Breast cancer.  ROS:   Review of Systems  Constitution: Negative for decreased appetite, fever and weight gain.  HENT: Negative for congestion, ear discharge, hoarse voice and sore throat.   Eyes: Negative for discharge, redness, vision loss in  right eye and visual halos.  Cardiovascular: Negative for chest pain, dyspnea on exertion, leg swelling, orthopnea and palpitations.  Respiratory: Negative for cough, hemoptysis, shortness of breath and snoring.   Endocrine: Negative for heat intolerance and polyphagia.  Hematologic/Lymphatic: Negative for bleeding problem. Does not bruise/bleed easily.  Skin: Negative for flushing, nail changes, rash and suspicious lesions.  Musculoskeletal: Negative for arthritis, joint pain, muscle cramps, myalgias, neck pain and stiffness.  Gastrointestinal: Negative for abdominal pain, bowel incontinence, diarrhea and excessive appetite.  Genitourinary: Negative for decreased libido, genital sores and incomplete emptying.  Neurological: Negative for brief paralysis, focal weakness, headaches and loss of balance.  Psychiatric/Behavioral: Negative for altered mental status, depression and suicidal ideas.  Allergic/Immunologic: Negative for HIV exposure and persistent infections.    EKGs/Labs/Other Studies Reviewed:    The following studies were reviewed today:   EKG:  The ekg ordered today demonstrates   Recent Labs: 07/03/2023: ALT 11; B Natriuretic Peptide 55.7; BUN 10; Creatinine, Ser 0.80; Hemoglobin 12.8; Platelets 238; Potassium 3.9; Sodium 136  Recent Lipid Panel No results found for: CHOL, TRIG, HDL, CHOLHDL, VLDL, LDLCALC, LDLDIRECT  Physical Exam:    VS:  BP 130/86 (BP Location: Right Arm, Patient Position: Sitting, Cuff Size: Normal)   Pulse 62   Ht 5' 3 (1.6 m)   Wt 185 lb (83.9 kg)   LMP 04/12/2003 (Approximate)   SpO2 98%   BMI 32.77 kg/m     Wt Readings from Last 3 Encounters:  01/23/24 185 lb (83.9 kg)  07/25/23 184 lb (83.5 kg)  07/03/23 185 lb (83.9 kg)     GEN: Well nourished, well developed in no acute distress HEENT: Normal NECK: No JVD; No carotid bruits LYMPHATICS: No lymphadenopathy CARDIAC: S1S2 noted,RRR, 2 out of 6 holosystolic murmurs, rubs,  gallops RESPIRATORY:  Clear to auscultation without rales, wheezing or rhonchi  ABDOMEN: Soft, non-tender, non-distended, +bowel sounds, no guarding. EXTREMITIES: No edema, No cyanosis, no clubbing MUSCULOSKELETAL:  No deformity  SKIN: Warm and dry NEUROLOGIC:  Alert and oriented x 3, non-focal PSYCHIATRIC:  Normal affect, good insight  ASSESSMENT:    No diagnosis found. 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 EKG shows QS pattern in inferior wall, suggesting past event. Family history of heart disease and personal history of hypertension and hyperlipidemia warrant further evaluation for coronary artery disease. - Order coronary CT scan to evaluate for coronary artery disease.  Cardiac murmur with history of rheumatic fever and possible mitral valve prolapse 2/6 low-pitched cardiac murmur likely related to rheumatic fever. Previous echocardiogram reportedly normal, further evaluation needed for mitral valve disease or prolapse. - Order echocardiogram to evaluate mitral valve and assess for structural abnormalities.  Hypertension - blood pressure at target  Hyperlipidemia - Hyperlipidemia with family history of high cholesterol. Current LDL 88. If coronary CT scan reveals plaque, target LDL will be adjusted to less than 70 to prevent atherosclerosis progression.  The patient is in agreement with the above plan. The patient left the office in stable condition.  The patient will follow up in   Medication Adjustments/Labs and Tests Ordered: Current medicines are reviewed at length with the patient today.  Concerns regarding medicines are outlined above.  No orders of the defined types were placed in this encounter.  No orders of the defined types were placed in this encounter.   There are no Patient Instructions on file for this visit.   Adopting a Healthy Lifestyle.  Know what a healthy weight is for you  (roughly BMI <25) and aim to maintain this   Aim for 7+ servings of fruits and vegetables daily   65-80+ fluid ounces of water  or unsweet tea for healthy kidneys   Limit to max 1 drink of alcohol per day; avoid smoking/tobacco   Limit animal fats in diet for cholesterol and heart health - choose grass fed whenever available   Avoid highly processed foods, and foods high in saturated/trans fats   Aim for low stress - take time to unwind and care  for your mental health   Aim for 150 min of moderate intensity exercise weekly for heart health, and weights twice weekly for bone health   Aim for 7-9 hours of sleep daily   When it comes to diets, agreement about the perfect plan isnt easy to find, even among the experts. Experts at the [facility] [employer] developed an idea known as the Healthy Eating Plate. Just imagine a plate divided into logical, healthy portions.   The emphasis is on diet quality:   Load up on vegetables and fruits - one-half of your plate: Aim for color and variety, and remember that potatoes dont count.   Go for whole grains - one-quarter of your plate: Whole wheat, barley, wheat berries, quinoa, oats, brown rice, and foods made with them. If you want pasta, go with whole wheat pasta.   Protein power - one-quarter of your plate: Fish, chicken, beans, and nuts are all healthy, versatile protein sources. Limit red meat.   The diet, however, does go beyond the plate, offering a few other suggestions.   Use healthy plant oils, such as olive, canola, soy, corn, sunflower and peanut. Check the labels, and avoid partially hydrogenated oil, which have unhealthy trans fats.   If youre thirsty, drink water . Coffee and tea are good in moderation, but skip sugary drinks and limit milk and dairy products to one or two daily servings.   The type of carbohydrate in the diet is more important than the amount. Some sources of carbohydrates, such as vegetables, fruits,  whole grains, and beans-are healthier than others.   Finally, stay active  Signed, Dub Huntsman, DO  01/23/2024 10:46 AM    [facility]

## 2024-01-24 ENCOUNTER — Other Ambulatory Visit: Payer: Self-pay | Admitting: Cardiology

## 2024-01-24 LAB — BASIC METABOLIC PANEL WITH GFR
BUN/Creatinine Ratio: 13 (ref 12–28)
BUN: 13 mg/dL (ref 8–27)
CO2: 24 mmol/L (ref 20–29)
Calcium: 9 mg/dL (ref 8.7–10.3)
Chloride: 104 mmol/L (ref 96–106)
Creatinine, Ser: 0.97 mg/dL (ref 0.57–1.00)
Glucose: 91 mg/dL (ref 70–99)
Potassium: 4.9 mmol/L (ref 3.5–5.2)
Sodium: 141 mmol/L (ref 134–144)
eGFR: 64 mL/min/1.73 (ref >59)

## 2024-01-24 LAB — MAGNESIUM: Magnesium: 2.2 mg/dL (ref 1.6–2.3)

## 2024-01-25 ENCOUNTER — Other Ambulatory Visit: Payer: Self-pay | Admitting: Cardiology

## 2024-01-26 ENCOUNTER — Other Ambulatory Visit: Payer: Self-pay | Admitting: Cardiology

## 2024-01-27 ENCOUNTER — Other Ambulatory Visit: Payer: Self-pay | Admitting: Cardiology

## 2024-01-28 ENCOUNTER — Other Ambulatory Visit: Payer: Self-pay | Admitting: Cardiology

## 2024-01-29 ENCOUNTER — Other Ambulatory Visit: Payer: Self-pay | Admitting: Cardiology

## 2024-01-29 ENCOUNTER — Telehealth: Payer: Self-pay | Admitting: Cardiology

## 2024-01-29 ENCOUNTER — Telehealth: Payer: Self-pay | Admitting: Pharmacy Technician

## 2024-01-29 NOTE — Telephone Encounter (Signed)
   I called [facility] and they said it was covered and it is free and ready   I called the patient to make her aware

## 2024-01-29 NOTE — Telephone Encounter (Signed)
 I called [facility] and they said it was covered and it is free and ready   I called the patient to make her aware

## 2024-01-29 NOTE — Telephone Encounter (Signed)
 Patient called stating her insurance didn't approve her one tablet for metoprolol  tartrate (LOPRESSOR ) 50 MG tablet  for her CT scan for this Friday.

## 2024-01-30 ENCOUNTER — Ambulatory Visit: Payer: Self-pay | Admitting: Cardiology

## 2024-02-01 ENCOUNTER — Telehealth (HOSPITAL_COMMUNITY): Payer: Self-pay | Admitting: *Deleted

## 2024-02-01 DIAGNOSIS — M47816 Spondylosis without myelopathy or radiculopathy, lumbar region: Secondary | ICD-10-CM | POA: Diagnosis not present

## 2024-02-01 NOTE — Telephone Encounter (Signed)
 Attempted to call patient regarding upcoming cardiac CT appointment. Left message on voicemail with name and callback number Sid Seats RN Navigator Cardiac Imaging [facility] [phone] Office

## 2024-02-02 ENCOUNTER — Ambulatory Visit (HOSPITAL_COMMUNITY)
Admission: RE | Admit: 2024-02-02 | Discharge: 2024-02-02 | Disposition: A | Discharge status: Home / Self Care | Source: Ambulatory Visit | Attending: Cardiology | Admitting: Cardiology

## 2024-02-02 DIAGNOSIS — R9431 Abnormal electrocardiogram [ECG] [EKG]: Secondary | ICD-10-CM | POA: Insufficient documentation

## 2024-02-02 MED ORDER — IOHEXOL 350 MG/ML SOLN
100.0000 mL | Freq: Once | INTRAVENOUS | Status: AC | PRN
  Administered 2024-02-02: 100 mL via INTRAVENOUS

## 2024-02-02 MED ORDER — NITROGLYCERIN 0.4 MG SL SUBL
0.8000 mg | SUBLINGUAL_TABLET | Freq: Once | SUBLINGUAL | Status: AC
  Administered 2024-02-02: 0.8 mg via SUBLINGUAL

## 2024-02-05 ENCOUNTER — Ambulatory Visit (HOSPITAL_COMMUNITY)
Admission: RE | Admit: 2024-02-05 | Discharge: 2024-02-05 | Disposition: A | Discharge status: Home / Self Care | Source: Ambulatory Visit | Attending: Cardiology | Admitting: Cardiology

## 2024-02-05 DIAGNOSIS — I341 Nonrheumatic mitral (valve) prolapse: Secondary | ICD-10-CM | POA: Diagnosis not present

## 2024-02-05 DIAGNOSIS — R011 Cardiac murmur, unspecified: Secondary | ICD-10-CM | POA: Diagnosis not present

## 2024-02-05 DIAGNOSIS — M25552 Pain in left hip: Secondary | ICD-10-CM | POA: Diagnosis not present

## 2024-02-05 LAB — ECHOCARDIOGRAM COMPLETE
Area-P 1/2: 3.79 cm2
S' Lateral: 2.67 cm

## 2024-03-12 ENCOUNTER — Ambulatory Visit: Admitting: Nurse Practitioner

## 2024-03-12 ENCOUNTER — Encounter: Payer: Self-pay | Admitting: Nurse Practitioner

## 2024-03-12 VITALS — BP 120/78 | HR 70 | Ht 62.25 in | Wt 192.0 lb

## 2024-03-12 DIAGNOSIS — R350 Frequency of micturition: Secondary | ICD-10-CM

## 2024-03-12 DIAGNOSIS — Z87448 Personal history of other diseases of urinary system: Secondary | ICD-10-CM

## 2024-03-12 DIAGNOSIS — B3731 Acute candidiasis of vulva and vagina: Secondary | ICD-10-CM | POA: Diagnosis not present

## 2024-03-12 DIAGNOSIS — N898 Other specified noninflammatory disorders of vagina: Secondary | ICD-10-CM

## 2024-03-12 DIAGNOSIS — N950 Postmenopausal bleeding: Secondary | ICD-10-CM

## 2024-03-12 LAB — WET PREP FOR TRICH, YEAST, CLUE

## 2024-03-12 MED ORDER — FLUCONAZOLE 150 MG PO TABS: 150.0000 mg | ORAL_TABLET | ORAL | 0 refills | Status: DC

## 2024-03-12 NOTE — Progress Notes (Signed)
   Acute Office Visit  Subjective:    Patient ID: Holly Beard, female    DOB: [birth year], 68 y.o.   MRN: [id number]   HPI 68 y.o. presents today for vaginal bleeding. Had episode of bright red blood a couple of weeks ago, very light, only with wiping, lasted about 2 days. H/O idiopathic hematuria. Does have some urinary frequency, not necessarily new for her. Drinks a lot of water . Denies odor, itching or discharge. Uses coconut oil externally for dryness. Not sexually active. Postmenopausal. No HRT.  Patient's last menstrual period was 04/12/2003 (approximate).    Review of Systems  Constitutional: Negative.   Genitourinary:  Positive for frequency, hematuria (history of microscopic) and vaginal bleeding. Negative for difficulty urinating, dysuria, flank pain, urgency, vaginal discharge and vaginal pain.       Objective:    Physical Exam Constitutional:      Appearance: Normal appearance.  Genitourinary:    General: Normal vulva.     Vagina: Vaginal discharge present. No erythema or bleeding.     Cervix: Normal.     Comments: Atrophic changes    BP 120/78   Pulse 70   Ht 5' 2.25 (1.581 m)   Wt 192 lb (87.1 kg)   LMP 04/12/2003 (Approximate)   SpO2 98%   BMI 34.84 kg/m  Wt Readings from Last 3 Encounters:  03/12/24 192 lb (87.1 kg)  01/23/24 185 lb (83.9 kg)  07/25/23 184 lb (83.5 kg)        Holly Beard, CMA present as biomedical engineer.   Wet prep negative for pathogens  UA: neg leukocytes, neg nitrites, neg protein, 1+ blood, dark yellow/slightly cloudy. Microscopic: wbc 0-5, rbc 10-20, few bacteria, few yeast present  Assessment & Plan:   Problem List Items Addressed This Visit   None Visit Diagnoses       PMB (postmenopausal bleeding)    -  Primary   Relevant Orders   US  PELVIS TRANSVAGINAL NON-OB (TV ONLY)     History of hematuria       Relevant Orders   Urinalysis,Complete w/RFL Culture     Vaginal discharge       Relevant Orders   WET PREP FOR  TRICH, YEAST, CLUE     Urinary frequency       Relevant Orders   Urinalysis,Complete w/RFL Culture     Vulvar candidiasis       Relevant Medications   fluconazole (DIFLUCAN) 150 MG tablet      Plan: Negative wet prep but yeast seen in UA. Diflucan 150 mg every 3 days x 2 doses. UA unremarkable, culture pending. Schedule ultrasound and consult with Dr. Nikki.     Holly DELENA Shutter DNP, 12:59 PM 03/12/2024

## 2024-03-13 LAB — URINALYSIS, COMPLETE W/RFL CULTURE
Bilirubin Urine: NEGATIVE
Glucose, UA: NEGATIVE
Hyaline Cast: NONE SEEN /LPF
Ketones, ur: NEGATIVE
Leukocyte Esterase: NEGATIVE
Nitrites, Initial: NEGATIVE
Protein, ur: NEGATIVE
Specific Gravity, Urine: 1.024 (ref 1.001–1.035)
pH: 5.5 (ref 5.0–8.0)

## 2024-03-13 LAB — URINE CULTURE
MICRO NUMBER:: 17301864
SPECIMEN QUALITY:: ADEQUATE

## 2024-03-13 LAB — CULTURE INDICATED

## 2024-03-14 ENCOUNTER — Ambulatory Visit: Payer: Self-pay | Admitting: Nurse Practitioner

## 2024-04-25 ENCOUNTER — Other Ambulatory Visit: Payer: Self-pay | Admitting: Obstetrics and Gynecology

## 2024-04-25 DIAGNOSIS — N898 Other specified noninflammatory disorders of vagina: Secondary | ICD-10-CM

## 2024-04-25 DIAGNOSIS — R350 Frequency of micturition: Secondary | ICD-10-CM

## 2024-04-25 DIAGNOSIS — B3731 Acute candidiasis of vulva and vagina: Secondary | ICD-10-CM

## 2024-04-25 DIAGNOSIS — Z87448 Personal history of other diseases of urinary system: Secondary | ICD-10-CM

## 2024-04-25 DIAGNOSIS — N950 Postmenopausal bleeding: Secondary | ICD-10-CM

## 2024-04-29 NOTE — Progress Notes (Unsigned)
 "  GYNECOLOGY  VISIT   HPI: 69 y.o.   Single  Caucasian female   [id number] with Patient's last menstrual period was 04/12/2003.   here for: U/S Consult for postmenopausal bleeding.   Bleeding for a couple of days in November.  Noticed with wiping.  Occurred for a couple of days, stopped, and then recurred.   Bleeding also occurred earlier in the year in 2025.    No cramping or pain.    Uses coconut oil externally on the vulva.  Bleeding not preceded by vaginal sexual activity or use of vaginal medication.   Hx of dark discharge if she does not use the coconut oil on the vulva.  Hx diverticulitis, which is currently under control.   Hx conization of cervix.    GYNECOLOGIC HISTORY: Patient's last menstrual period was 04/12/2003. Contraception:  PMP Menopausal hormone therapy:  n/a Last 2 paps:   06/17/21 neg: HR HPV neg, 12/22/17 neg: HR HPV neg  History of abnormal Pap or positive HPV:   yes,  years ago--had colposcopy and repeat pap--no treatment   Mammogram:  08/09/23 Breast Density Cat B, BIRADS Cat 1 neg         OB History     Gravida  0   Para  0   Term  0   Preterm  0   AB  0   Living  0      SAB  0   IAB  0   Ectopic  0   Multiple  0   Live Births                 Patient Active Problem List   Diagnosis Date Noted   Primary localized osteoarthritis of right hip 08/21/2018   Primary osteoarthritis of right hip 08/21/2018   Abnormal sense of taste 04/17/2018   Perennial allergic rhinitis 04/17/2018   Essential hypertension 11/22/2013    Class: History of   Other and unspecified hyperlipidemia 11/22/2013    Class: History of    Past Medical History:  Diagnosis Date   Abnormal Pap smear of cervix    yrs ago   Arthritis    Diverticulitis    Family history of adverse reaction to anesthesia    sister- gets wild    Graves disease    in remission    Graves' disease without crisis 1997   remission   History of diverticulitis    History of  rheumatic fever age 17   Hyperlipidemia    Hypertension    Hypothyroidism    Pneumonia    hx  of years ago    Shingles 2021    Past Surgical History:  Procedure Laterality Date   BICEPS TENDON REPAIR Left    Left shoulder   CERVICAL CONE BIOPSY     yrs ago   COCCYX REMOVAL     sledding accident   KNEE RECONSTRUCTION Right    x 2   NASAL SEPTUM SURGERY     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     cetirizine (ZYRTEC) 10 MG tablet Take 10 mg by mouth at bedtime.      Cyanocobalamin  (B-12 PO) Take 1,250 mg by mouth daily.     diclofenac Sodium (VOLTAREN) 1 % GEL Apply 2 g topically at bedtime.     etodolac (LODINE) 400 MG tablet Take by mouth.     HYDROcodone-acetaminophen  (NORCO/VICODIN) 5-325 MG tablet Take 1 tablet by mouth every 6 (six) hours as needed for moderate pain.     levothyroxine  (SYNTHROID , LEVOTHROID) 25 MCG tablet Take 25 mcg by mouth daily before breakfast.   11   losartan  (COZAAR ) 100 MG tablet Take 100 mg by mouth daily.   3   Melatonin 10 MG TABS Take 10 mg by mouth at bedtime.     meloxicam (MOBIC) 15 MG tablet Take 15 mg by mouth daily.     Menthol , Topical Analgesic, (BIOFREEZE EX) Apply 1 application topically daily as needed (hip pain).      metoprolol  succinate (TOPROL -XL) 100 MG 24 hr tablet Take 100 mg by mouth daily.     metroNIDAZOLE  (METROGEL ) 0.75 % gel Apply 1 application topically daily as needed (rosacea).   11   montelukast  (SINGULAIR ) 10 MG  tablet Take 10 mg by mouth at bedtime.      Multiple Vitamins-Minerals (PRESERVISION AREDS PO) Take by mouth.     omeprazole (PRILOSEC) 40 MG capsule 40 mg daily.     simvastatin  (ZOCOR ) 20 MG tablet Take 20 mg by mouth at bedtime.      SYSTANE ULTRA 0.4-0.3 % SOLN Place 1 drop into both eyes daily as needed (Dry eyes).     VITAMIN D  PO Take 1,000 Int'l Units by mouth daily.     No current facility-administered medications for this visit.     ALLERGIES: Ace inhibitors, Lisinopril, Morphine and codeine, and Sulfa antibiotics  Family History  Problem Relation Age of Onset   Hyperlipidemia Mother    Hypertension Mother    Hypertension Father    Hyperlipidemia Father    Diabetes Sister    Hypertension Sister    Hyperlipidemia Sister    Dementia Sister    Hypertension Brother    Stroke Maternal Grandmother 57   Heart attack Paternal Grandmother    Heart attack Paternal Grandfather    Breast cancer Neg Hx     Social History   Socioeconomic History   Marital status: Single    Spouse name: Not on file   Number of children: 0   Years of education: Not on file   Highest education level: Bachelor's degree (e.g., BA, AB, BS)  Occupational History   Not on file  Tobacco Use   Smoking status: Former    Current packs/day: 0.00    Types: Cigarettes    Quit date: 2010    Years since quitting: 16.0   Smokeless tobacco: Never  Vaping Use   Vaping status: Never Used  Substance and Sexual Activity   Alcohol use: Yes    Comment: 1-6 drinks/week   Drug use: No   Sexual activity: Not Currently    Partners: Female    Birth control/protection: Post-menopausal    Comment: less than 5, IC after 16, no STD, no abnormal pap, no DES  Other Topics Concern   Not on file  Social History Narrative   Not on file   Social Drivers of Health   Tobacco Use: Medium Risk (04/30/2024)   Patient History  Smoking Tobacco Use: Former    Smokeless Tobacco Use: Never    Passive Exposure: Not on  Actuary Strain: Not on file  Food Insecurity: Not on file  Transportation Needs: Not on file  Physical Activity: Not on file  Stress: Not on file  Social Connections: Not on file  Intimate Partner Violence: Not on file  Depression (EYV7-0): Not on file  Alcohol Screen: Not on file  Housing: Not on file  Utilities: Not on file  Health Literacy: Not on file    Review of Systems  See HPI.   PHYSICAL EXAMINATION:   BP 126/84 (BP Location: Left Arm, Patient Position: Sitting)   Pulse 64   LMP 04/12/2003   SpO2 96%     General appearance: alert, cooperative and appears stated age   Pelvic US  Uterus 4.77 x 3.05 x 2.19 cm.   No myometrial masses.  EMS 1.44 mm with sliver of fluid.  Thin.  Symmetric.  No masses.   Left ovary 1.64 x 1.07 x 1.44 cm.  Atrophic. Right ovary 1.64 x 1.15 x 1.32 cm.  Atrophic.  No adnexal masses.  No free fluid.   ASSESSMENT:  Recurrent postmenopausal bleeding.  Fluid in the endometrial canal.  Hx cervical conization.   PLAN:  Pelvic US  images and report reviewed.  Potential causes of bleeding:  atrophy, hyperplasia, malignancy.  Return for cervical cancer screening and endometrial biopsy.  Rationale explained.  Questions invited and answered.  30 min  total time was spent for this patient encounter, including preparation, face-to-face counseling with the patient, coordination of care, and documentation of the encounter.    "

## 2024-04-30 ENCOUNTER — Encounter: Payer: Self-pay | Admitting: Obstetrics and Gynecology

## 2024-04-30 ENCOUNTER — Ambulatory Visit (INDEPENDENT_AMBULATORY_CARE_PROVIDER_SITE_OTHER): Admitting: Obstetrics and Gynecology

## 2024-04-30 ENCOUNTER — Ambulatory Visit

## 2024-04-30 VITALS — BP 126/84 | HR 64

## 2024-04-30 DIAGNOSIS — N950 Postmenopausal bleeding: Secondary | ICD-10-CM

## 2024-04-30 DIAGNOSIS — N859 Noninflammatory disorder of uterus, unspecified: Secondary | ICD-10-CM | POA: Diagnosis not present

## 2024-04-30 NOTE — Patient Instructions (Signed)
 Endometrial Biopsy  An endometrial biopsy is a procedure where a tissue sample is removed from the lining of the uterus. This lining is called the endometrium. The tissue sample is then sent to a lab for testing. You may have this type of biopsy to check for: Cancer. Infection. Growths called polyps. Uterine bleeding that can't be explained. Tell a health care provider about: Any allergies you have. All medicines you're taking including vitamins, herbs, eye drops, creams, and over-the-counter medicines. Any problems you or family members have had with anesthesia. Any bleeding problems you have. Any surgeries you have had. Any medical problems you have. Whether you're pregnant or may be pregnant. What are the risks? Your health care provider will talk with you about risks. These may include: Bleeding. Infection. Allergic reactions to medicines. Damage to the wall of the uterus. This is rare. What happens before the procedure? Keep track of your period. You may need to have this biopsy when you're not having your period. Ask your provider about: Changing or stopping your regular medicines. These include any diabetes medicines or blood thinners you take. Taking medicines such as aspirin and ibuprofen. These medicines can thin your blood. Do not take them unless your provider tells you to. Taking over-the-counter medicines, vitamins, herbs, and supplements. Bring a pad with you. You may need to wear one after the biopsy. Plan to have a responsible adult take you home from the hospital or clinic. You won't be allowed to drive. What happens during the procedure? A tool will be put into your vagina to hold it open. This helps your provider see the cervix. The cervix is the lowest part of the uterus. Your cervix will be cleaned with a solution that kills germs. You will be given anesthesia. This keeps you from feeling pain. It will numb your cervix. A tool called forceps will be used to  hold your cervix steady. A thin tool called a uterine sound will be put through your cervix. It will be used to: Find the length of your uterus. Find where to take the sample from. A soft tube called a catheter will be put into your uterus. The catheter will remove a tissue sample. The tube and tools will be removed. The sample will be sent to a lab for testing. The procedure may vary among providers and hospitals. What happens after the procedure? Your blood pressure, heart rate, breathing rate, and blood oxygen level will be monitored until you leave the hospital or clinic. It's up to you to get the results of your procedure. Ask your provider, or the department that is doing the procedure, when your results will be ready. This information is not intended to replace advice given to you by your health care provider. Make sure you discuss any questions you have with your health care provider. Document Revised: 06/07/2022 Document Reviewed: 06/07/2022 Elsevier Patient Education  2024 [employer].

## 2024-05-08 ENCOUNTER — Ambulatory Visit: Admitting: Cardiology

## 2024-05-16 ENCOUNTER — Encounter: Payer: Self-pay | Admitting: Cardiology

## 2024-05-16 ENCOUNTER — Ambulatory Visit: Admitting: Cardiology

## 2024-05-16 VITALS — BP 130/88 | HR 70 | Ht 63.0 in | Wt 188.0 lb

## 2024-05-16 DIAGNOSIS — I10 Essential (primary) hypertension: Secondary | ICD-10-CM | POA: Diagnosis not present

## 2024-05-16 DIAGNOSIS — Z01810 Encounter for preprocedural cardiovascular examination: Secondary | ICD-10-CM | POA: Diagnosis not present

## 2024-05-16 DIAGNOSIS — E782 Mixed hyperlipidemia: Secondary | ICD-10-CM

## 2024-05-16 NOTE — Patient Instructions (Addendum)
 Medication Instructions:  Your physician recommends that you continue on your current medications as directed. Please refer to the Current Medication list given to you today.  *If you need a refill on your cardiac medications before your next appointment, please call your pharmacy*   Follow-Up: At [facility], you and your health needs are our priority.  As part of our continuing mission to provide you with exceptional heart care, our providers are all part of one team.  This team includes your primary Cardiologist (physician) and Advanced Practice Providers or APPs (Physician Assistants and Nurse Practitioners) who all work together to provide you with the care you need, when you need it.  Your next appointment:   1 year(s)  Provider:   Thomasene Ripple, DO     Other Instructions

## 2024-05-16 NOTE — Progress Notes (Signed)
 " Cardiology Office Note:    Date:  05/16/2024   ID:  Holly, Beard 07/30/1955, MRN [id number]  PCP:  Holly Anthony RAMAN, FNP  Cardiologist:  Dub Huntsman, DO  Electrophysiologist:  None   Referring MD: Holly Anthony RAMAN, FNP    I am   History of Present Illness:    Holly Beard is a 69 y.o. female with a hx of diverticulitis, Graves' disease, history of rheumatic fever, history of hypertension, hypothyroidism and mitral valve prolapse seen on recent echocardiogram.  Today she offers no specific cardiovascular complaint.  But she tells me that she is pending surgical intervention for her hip.  Before this she is going to be going to [place] she will in the interim have the injection.  But schedule surgery is for me.  No chest pain or shortness of breath   Past Medical History:  Diagnosis Date   Abnormal Pap smear of cervix    yrs ago   Arthritis    Diverticulitis    Family history of adverse reaction to anesthesia    sister- gets wild    Graves disease    in remission    Graves' disease without crisis 1997   remission   History of diverticulitis    History of rheumatic fever age 13   Hyperlipidemia    Hypertension    Hypothyroidism    Pneumonia    hx  of years ago    Shingles 2021    Past Surgical History:  Procedure Laterality Date   BICEPS TENDON REPAIR Left    Left shoulder   CERVICAL CONE BIOPSY     yrs ago   COCCYX REMOVAL     sledding accident   KNEE RECONSTRUCTION Right    x 2   NASAL SEPTUM SURGERY     TOTAL HIP ARTHROPLASTY Right 08/21/2018   Procedure: TOTAL HIP ARTHROPLASTY ANTERIOR APPROACH;  Surgeon: Sheril Coy, MD;  Location: [facility];  Service: Orthopedics;  Laterality: Right;   TOTAL KNEE ARTHROPLASTY Right 2007   TOTAL SHOULDER ARTHROPLASTY Left 12/16/2021   Procedure: TOTAL SHOULDER ARTHROPLASTY;  Surgeon: Dozier Soulier, MD;  Location: [facility];  Service: Orthopedics;  Laterality: Left;  recieved block     Current  Medications: Current Meds  Medication Sig   amLODipine  (NORVASC ) 5 MG tablet Take 5 mg by mouth daily.   cetirizine (ZYRTEC) 10 MG tablet Take 10 mg by mouth at bedtime.    Cyanocobalamin  (B-12 PO) Take 1,250 mg by mouth daily.   diclofenac Sodium (VOLTAREN) 1 % GEL Apply 2 g topically at bedtime.   fluorouracil (EFUDEX) 5 % cream Apply 1 Application topically daily.   HYDROcodone-acetaminophen  (NORCO/VICODIN) 5-325 MG tablet Take 1 tablet by mouth every 6 (six) hours as needed for moderate pain. (Patient taking differently: Take 1 tablet by mouth at bedtime.)   levothyroxine  (SYNTHROID , LEVOTHROID) 25 MCG tablet Take 25 mcg by mouth daily before breakfast.    losartan  (COZAAR ) 100 MG tablet Take 100 mg by mouth daily.    Melatonin 10 MG TABS Take 10 mg by mouth at bedtime.   meloxicam (MOBIC) 15 MG tablet Take 15 mg by mouth daily.   Menthol , Topical Analgesic, (BIOFREEZE EX) Apply 1 application topically daily as needed (hip pain).    metoprolol  succinate (TOPROL -XL) 100 MG 24 hr tablet Take 100 mg by mouth daily.   metroNIDAZOLE  (METROGEL ) 0.75 % gel Apply 1 application topically daily as needed (rosacea).    montelukast  (SINGULAIR ) 10 MG  tablet Take 10 mg by mouth at bedtime.    Multiple Vitamins-Minerals (PRESERVISION AREDS PO) Take by mouth.   omeprazole (PRILOSEC) 40 MG capsule 40 mg daily. (Patient taking differently: 40 mg every other day.)   simvastatin  (ZOCOR ) 20 MG tablet Take 20 mg by mouth at bedtime.    SYSTANE ULTRA 0.4-0.3 % SOLN Place 1 drop into both eyes daily as needed (Dry eyes).   VITAMIN D  PO Take 1,000 Int'l Units by mouth daily.     Allergies:   Ace inhibitors, Lisinopril, Morphine and codeine, and Sulfa antibiotics   Social History   Socioeconomic History   Marital status: Single    Spouse name: Not on file   Number of children: 0   Years of education: Not on file   Highest education level: Master's degree (e.g., MA, MS, MEng, MEd, MSW, MBA)  Occupational  History   Not on file  Tobacco Use   Smoking status: Former    Current packs/day: 0.00    Types: Cigarettes    Quit date: 2010    Years since quitting: 16.1   Smokeless tobacco: Never  Vaping Use   Vaping status: Never Used  Substance and Sexual Activity   Alcohol use: Yes    Comment: 1-6 drinks/week   Drug use: No   Sexual activity: Not Currently    Partners: Female    Birth control/protection: Post-menopausal    Comment: less than 5, IC after 16, no STD, no abnormal pap, no DES  Other Topics Concern   Not on file  Social History Narrative   Not on file   Social Drivers of Health   Tobacco Use: Medium Risk (05/16/2024)   Patient History    Smoking Tobacco Use: Former    Smokeless Tobacco Use: Never    Passive Exposure: Not on Actuary Strain: Low Risk (05/12/2024)   Overall Financial Resource Strain (CARDIA)    Difficulty of Paying Living Expenses: Not hard at all  Food Insecurity: No Food Insecurity (05/12/2024)   Epic    Worried About Radiation Protection Practitioner of Food in the Last Year: Never true    Ran Out of Food in the Last Year: Never true  Transportation Needs: No Transportation Needs (05/12/2024)   Epic    Lack of Transportation (Medical): No    Lack of Transportation (Non-Medical): No  Physical Activity: Insufficiently Active (05/12/2024)   Exercise Vital Sign    Days of Exercise per Week: 2 days    Minutes of Exercise per Session: 30 min  Stress: No Stress Concern Present (05/12/2024)   [employer] of Occupational Health - Occupational Stress Questionnaire    Feeling of Stress: Not at all  Social Connections: Moderately Integrated (05/12/2024)   Social Connection and Isolation Panel    Frequency of Communication with Friends and Family: More than three times a week    Frequency of Social Gatherings with Friends and Family: Once a week    Attends Religious Services: 1 to 4 times per year    Active Member of Clubs or Organizations: Yes    Attends Tax Inspector Meetings: 1 to 4 times per year    Marital Status: Never married  Depression (PHQ2-9): Not on file  Alcohol Screen: Low Risk (05/12/2024)   Alcohol Screen    Last Alcohol Screening Score (AUDIT): 2  Housing: Low Risk (05/12/2024)   Epic    Unable to Pay for Housing in the Last Year: No    Number of  Times Moved in the Last Year: 0    Homeless in the Last Year: No  Utilities: Not on file  Health Literacy: Not on file     Family History: The patient's family history includes Dementia in her sister; Diabetes in her sister; Heart attack in her paternal grandfather and paternal grandmother; Hyperlipidemia in her father, mother, and sister; Hypertension in her brother, father, mother, and sister; Stroke (age of onset: 20) in her maternal grandmother. There is no history of Breast cancer.  ROS:   Review of Systems  Constitution: Negative for decreased appetite, fever and weight gain.  HENT: Negative for congestion, ear discharge, hoarse voice and sore throat.   Eyes: Negative for discharge, redness, vision loss in right eye and visual halos.  Cardiovascular: Negative for chest pain, dyspnea on exertion, leg swelling, orthopnea and palpitations.  Respiratory: Negative for cough, hemoptysis, shortness of breath and snoring.   Endocrine: Negative for heat intolerance and polyphagia.  Hematologic/Lymphatic: Negative for bleeding problem. Does not bruise/bleed easily.  Skin: Negative for flushing, nail changes, rash and suspicious lesions.  Musculoskeletal: Negative for arthritis, joint pain, muscle cramps, myalgias, neck pain and stiffness.  Gastrointestinal: Negative for abdominal pain, bowel incontinence, diarrhea and excessive appetite.  Genitourinary: Negative for decreased libido, genital sores and incomplete emptying.  Neurological: Negative for brief paralysis, focal weakness, headaches and loss of balance.  Psychiatric/Behavioral: Negative for altered mental status, depression  and suicidal ideas.  Allergic/Immunologic: Negative for HIV exposure and persistent infections.    EKGs/Labs/Other Studies Reviewed:    The following studies were reviewed today:   EKG:  The ekg ordered today demonstrates   Recent Labs: 07/03/2023: ALT 11; B Natriuretic Peptide 55.7; Hemoglobin 12.8; Platelets 238 01/23/2024: BUN 13; Creatinine, Ser 0.97; Magnesium 2.2; Potassium 4.9; Sodium 141  Recent Lipid Panel No results found for: CHOL, TRIG, HDL, CHOLHDL, VLDL, LDLCALC, LDLDIRECT  Physical Exam:    VS:  BP 130/88 (BP Location: Left Arm, Patient Position: Sitting, Cuff Size: Normal)   Pulse 70   Ht 5' 3 (1.6 m)   Wt 188 lb (85.3 kg)   LMP 04/12/2003   SpO2 95%   BMI 33.30 kg/m     Wt Readings from Last 3 Encounters:  05/16/24 188 lb (85.3 kg)  05/08/24 184 lb (83.5 kg)  03/12/24 192 lb (87.1 kg)     GEN: Well nourished, well developed in no acute distress HEENT: Normal NECK: No JVD; No carotid bruits LYMPHATICS: No lymphadenopathy CARDIAC: S1S2 noted,RRR, 2 out of 6 holosystolic murmurs, rubs, gallops RESPIRATORY:  Clear to auscultation without rales, wheezing or rhonchi  ABDOMEN: Soft, non-tender, non-distended, +bowel sounds, no guarding. EXTREMITIES: No edema, No cyanosis, no clubbing MUSCULOSKELETAL:  No deformity  SKIN: Warm and dry NEUROLOGIC:  Alert and oriented x 3, non-focal PSYCHIATRIC:  Normal affect, good insight  ASSESSMENT:    1. Essential hypertension   2. Preoperative cardiovascular examination   3. Mixed hyperlipidemia   4. Morbid obesity (HCC)    PLAN:    Recent coronary CTA with no evidence of coronary artery disease, recent echocardiogram normal heart function with borderline/very mild mitral valve prolapse.  No significant valvular regurgitation.  Hypertension - blood pressure at target  Hyperlipidemia -continue simvastatin   The patient does not have any unstable cardiac conditions.  Upon evaluation today, she can  achieve 4 METs or greater without anginal symptoms.  According to [facility] and AHA guidelines, she requires no further cardiac workup prior to her noncardiac surgery and should be  at acceptable risk.  Our service is available as necessary in the perioperative period. She also did have a form from her surgeon office which I was able to fill today.  We made a copy and give her a copy as well.  The patient is in agreement with the above plan. The patient left the office in stable condition.  The patient will follow up in   Medication Adjustments/Labs and Tests Ordered: Current medicines are reviewed at length with the patient today.  Concerns regarding medicines are outlined above.  Orders Placed This Encounter  Procedures   EKG 12-Lead   No orders of the defined types were placed in this encounter.   Patient Instructions  Medication Instructions:  Your physician recommends that you continue on your current medications as directed. Please refer to the Current Medication list given to you today.  *If you need a refill on your cardiac medications before your next appointment, please call your pharmacy*  Follow-Up: At [facility], you and your health needs are our priority.  As part of our continuing mission to provide you with exceptional heart care, our providers are all part of one team.  This team includes your primary Cardiologist (physician) and Advanced Practice Providers or APPs (Physician Assistants and Nurse Practitioners) who all work together to provide you with the care you need, when you need it.  Your next appointment:   1 year(s)  Provider:   Obrian Bulson, DO    Other Instructions:            Adopting a Healthy Lifestyle.  Know what a healthy weight is for you (roughly BMI <25) and aim to maintain this   Aim for 7+ servings of fruits and vegetables daily   65-80+ fluid ounces of water  or unsweet tea for healthy kidneys   Limit to max 1 drink of alcohol per  day; avoid smoking/tobacco   Limit animal fats in diet for cholesterol and heart health - choose grass fed whenever available   Avoid highly processed foods, and foods high in saturated/trans fats   Aim for low stress - take time to unwind and care for your mental health   Aim for 150 min of moderate intensity exercise weekly for heart health, and weights twice weekly for bone health   Aim for 7-9 hours of sleep daily   When it comes to diets, agreement about the perfect plan isnt easy to find, even among the experts. Experts at the [facility] of [employer] developed an idea known as the Healthy Eating Plate. Just imagine a plate divided into logical, healthy portions.   The emphasis is on diet quality:   Load up on vegetables and fruits - one-half of your plate: Aim for color and variety, and remember that potatoes dont count.   Go for whole grains - one-quarter of your plate: Whole wheat, barley, wheat berries, quinoa, oats, brown rice, and foods made with them. If you want pasta, go with whole wheat pasta.   Protein power - one-quarter of your plate: Fish, chicken, beans, and nuts are all healthy, versatile protein sources. Limit red meat.   The diet, however, does go beyond the plate, offering a few other suggestions.   Use healthy plant oils, such as olive, canola, soy, corn, sunflower and peanut. Check the labels, and avoid partially hydrogenated oil, which have unhealthy trans fats.   If youre thirsty, drink water . Coffee and tea are good in moderation, but skip sugary drinks and  limit milk and dairy products to one or two daily servings.   The type of carbohydrate in the diet is more important than the amount. Some sources of carbohydrates, such as vegetables, fruits, whole grains, and beans-are healthier than others.   Finally, stay active  Signed, Twain Stenseth, DO  05/16/2024 11:23 AM    [facility] "

## 2024-05-27 ENCOUNTER — Ambulatory Visit: Admitting: Obstetrics and Gynecology
